# Patient Record
Sex: Male | Born: 1965 | Race: White | Hispanic: No | Marital: Married
Health system: Midwestern US, Academic
[De-identification: ages and names within clinical notes are randomized; demographics above are authoritative.]

---

## 2020-08-06 ENCOUNTER — Encounter: Admit: 2020-08-06 | Discharge: 2020-08-06 | Payer: BC Managed Care – PPO | Primary: Primary Care

## 2020-08-06 NOTE — Telephone Encounter
Navigation Intake Assessment Document    Patient Name:  Alejandro Robinson  DOB:  May 30, 1966  Insurance:   Rudean Curt    Appointment Info:    Future Appointments   Date Time Provider Department Center   08/21/2020  1:00 PM Cori Razor, MD [facility] Exam     Diagnosis & Reason for Visit:  Lymphadenopathy     Physician Info:   Referring Physician:  [name], FNP - [facility]    Contact Name & Number:  Angelina Sheriff. [phone]    Location of Films:  PACS     Records can be viewed in the Outside Records tab.     History of Present Illness:  Patient is a 54 year old male who reports swollen lymph nodes in his neck for about 2 years, though they are slightly worse now than before. An ultrasound was ordered by his PCP and done on 07/25/20 showing enlarged cervical lymph nodes bilaterally. A CT scan was then done on 08/05/20 showing extensive bilateral cervical, supraclavicular, and mediastinal adenopathy with leading consideration being lymphoma. He is now being referred to hematology for further evaluation. The patient denies any B-symptoms.     Allergies reviewed and verified with the patient, and documented in Epic:  Yes    Comments:  COVID-19 guidelines reviewed with patient, including: visitor and universal masking policies, and a temperature check at the facility entrance upon arrival.

## 2020-08-08 ENCOUNTER — Encounter: Admit: 2020-08-08 | Discharge: 2020-08-08 | Payer: BC Managed Care – PPO | Primary: Primary Care

## 2020-08-21 ENCOUNTER — Encounter: Admit: 2020-08-21 | Discharge: 2020-08-21 | Payer: BC Managed Care – PPO | Primary: Primary Care

## 2020-08-21 DIAGNOSIS — R591 Generalized enlarged lymph nodes: Secondary | ICD-10-CM

## 2020-08-21 DIAGNOSIS — Z5181 Encounter for therapeutic drug level monitoring: Secondary | ICD-10-CM

## 2020-08-21 LAB — CBC AND DIFF
Lab: 0.1 K/UL (ref 0–0.20)
Lab: 0.3 K/UL (ref 0–0.45)
Lab: 0.8 K/UL (ref 0–0.80)
Lab: 1 % (ref 0–2)
Lab: 1.9 K/UL (ref 1.0–4.8)
Lab: 11 % (ref 4–12)
Lab: 13 % (ref 11–15)
Lab: 14 g/dL (ref 13.5–16.5)
Lab: 26 % (ref 24–44)
Lab: 264 K/UL (ref 150–400)
Lab: 29 pg (ref 26–34)
Lab: 3 % (ref >60)
Lab: 33 g/dL (ref 32.0–36.0)
Lab: 4.3 K/UL (ref 1.8–7.0)
Lab: 4.9 M/UL (ref 4.4–5.5)
Lab: 44 % (ref 40–50)
Lab: 59 % (ref 41–77)
Lab: 7.3 K/UL (ref 4.5–11.0)
Lab: 7.5 FL (ref 7–11)
Lab: 89 FL (ref 80–100)

## 2020-08-21 LAB — COMPREHENSIVE METABOLIC PANEL
Lab: 143 MMOL/L (ref 137–147)
Lab: 5.2 MMOL/L — ABNORMAL HIGH (ref 3.5–5.1)

## 2020-08-21 LAB — HEPATITIS B CORE IGM AB

## 2020-08-21 LAB — URIC ACID: Lab: 4.7 mg/dL — ABNORMAL HIGH (ref 4.0–8.0)

## 2020-08-21 LAB — IMMUNOGLOBULINS-IGA,IGG,IGM
Lab: 119 mg/dL (ref 70–390)
Lab: 832 mg/dL (ref 762–1488)

## 2020-08-21 LAB — HEPATITIS C ANTIBODY W REFLEX HCV PCR QUANT

## 2020-08-21 LAB — HEPATITIS B SURFACE AB: Lab: NEGATIVE

## 2020-08-21 LAB — HEPATITIS B SURFACE AG

## 2020-08-21 LAB — LDH-LACTATE DEHYDROGENASE: Lab: 222 U/L — ABNORMAL HIGH (ref 100–210)

## 2020-08-21 NOTE — Progress Notes
Name: Alejandro Robinson          MRN: [id number]      DOB: 1966-06-08      AGE: 55 y.o.   DATE OF SERVICE: 08/21/2020    Subjective:             Reason for Visit: Hematology oncology consultation to evaluate patient with diffuse abnormal lymphadenopathy, suspected lymphoma    Requesting provider: [name], FNP    Heme/Onc Care      Alejandro Robinson is a 55 y.o. male.       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    He has noted some mild adenopathy in bilateral neck regions for approximately 2 years and over the last 8 months has noted an increase in adenopathy as well as some inguinal adenopathy left greater than right.  He denies any fevers sweats pruritus chills or weight loss but his wife Alejandro Robinson does indicate he has experienced perhaps some sweats and some fatigue.  The nodes are not painful.  In general he feels well.  He denies any anorexia or weight loss.    He presented to his local nurse practitioner Alejandro Piedra, FNP at the [facility] clinic recently and underwent lab work 07/23/2020.  CBC, CMP, and PSA were normal.  07/25/2020 ultrasound did show enlarged bilateral cervical adenopathy.    08/05/2020 CT scan showed extensive bilateral cervical supraclavicular and mediastinal adenopathy with leading consideration being lymphoma.    08/12/2020 PET scan performed at [facility] does show diffuse adenopathy of the Ballentine tonsils with SUV of 8.25, extensive bilateral cervical chain adenopathy with lower SUVs of 2.9, 2.2, and 1.8, significant abnormal mediastinal and axillary adenopathy, abnormal retroperitoneal pelvic sidewall external iliac and inguinal adenopathy.  There is no splenomegaly.    Past medical history positive only for previous left hand repair and removal of left knee meniscus via arthroscopic procedure.  He did have a lumbar puncture at age 4 for evaluation of headaches.  No previous blood transfusions.    Social history: He is a never smoker but does use snuff.  He drinks approximately 4-5 shots of liquor per week.  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  He is accompanied by his wife Alejandro Robinson who is 1/5 grade teacher at [employer] in [place].  Alejandro Robinson and Alejandro Robinson have 2 sons and 2 daughters.    Family history: Mother deceased due to breast cancer at age 81.  Maternal grandfather with prostate cancer at age 5.       Review of Systems  Generally feeling well.  Perhaps some fatigue.  See HPI.  He does believe that he had a COVID infection in November 2020 around Thanksgiving.  He did recently have a flu vaccination.  He has not yet undergone COVID vaccination but is very agreeable to do so.    Constitutional: Negative for fever and chills.   HENT: Negative for ear pain, sore throat, mouth sores, trouble swallowing, neck pain and sinus pressure.    Eyes: Negative for visual disturbance.   Respiratory: Negative for shortness of breath.    Cardiovascular: Negative for chest pain.   Gastrointestinal: Negative for nausea, diarrhea, constipation and blood in stool.   Genitourinary: Negative for dysuria, urgency, frequency, difficulty urinating and dyspareunia.   Musculoskeletal: Negative.  Neurological: Negative for weakness, numbness and headaches.   Hematological: Does not bruise/bleed easily.   Psychiatric/Behavioral: Negative for confusion and decreased concentration. The patient is not nervous/anxious.        Objective:         ? multivitamin (MULTIPLE VITAMIN PO) Take  by mouth.   ? other medication 1 Dose. Ageless Male 2 daily   ? other medication 1 Dose. Super Beta Prostate     Vitals:    08/21/20 1258   BP: 133/73   BP Source: Arm, Left Upper   Patient Position: Sitting   Pulse: 57   Resp: 18   Temp: 36.2 ?C (97.2 ?F)   TempSrc: Temporal   SpO2: 100%   Weight: 88.5 kg (195 lb)   Height: 182.9 cm (72)   PainSc: Zero     Body mass index is 26.45 kg/m?[name]     Pain Score: Zero       Fatigue Scale: 0-None    Pain Addressed:  N/A    Patient Evaluated for a Clinical Trial: No treatment clinical trial available for this patient.     [place] Cooperative Oncology Group performance status is 0, Fully active, able to carry on all pre-disease performance without restriction.[name]     Physical Exam     Very pleasant well-appearing Caucasian male in no distress.  HEENT: Slightly ruddy facial complexion no scleral icterus.  Neck: Multiple enlarged rubbery cervical and supraclavicular and occipital nodes.  Axilla: Bilateral rubbery axillary adenopathy right greater than left.  Abdomen: Scaphoid.  No hepatosplenomegaly.  Mild bilateral inguinal adenopathy.  Neuro: Alert and oriented x3.     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  I reviewed the outside lab work as well as all of the radiology imaging.  I discussed with the patient and his wife that this may represent a low-grade lymphoma such as follicular lymphoma as symptoms have been present for 2 years but diffuse large cell lymphoma is also on the differential.  We will arrange for baseline lab work to include hepatitis B and C serology, uric acid, LDH, quantitative immunoglobulins.  We will plan baseline echocardiogram and case anthracycline chemotherapy is required.  We will plan right axillary ultrasound-guided lymph node biopsy and bone marrow biopsy to determine lymphoma subtype and complete staging.  Will arrange a follow-up appointment approximately 4 to 5 days after his node and bone marrow biopsies to discuss final staging, subtype, and treatment recommendations.  He understands that he will require treatment with chemotherapy and chemotherapy will be determined by the subtype of non-Hodgkin's lymphoma.  I discussed with him that it is possible that he may require stem cell transplant or CAR-T cell therapy in the future depending upon subtype and remission status after treatment.    Family history of malignancies  Sagan mother died quite young of breast cancer at age 4 and maternal grandfather had prostate cancer.  At one of our follow-up visits we will plan genetic consultation and testing as this could impact Rosaire and his family members for deleterious mutation is found.    45 minutes was spent with the patient and his wife and all their questions were answered to their satisfaction.  An additional 35 minutes was spent in review of records, documentation, and coordination of care.    This note is partially generated using voice recognition software.  Please excuse any  typographical errors.

## 2020-08-22 ENCOUNTER — Encounter: Admit: 2020-08-22 | Discharge: 2020-08-22 | Payer: BC Managed Care – PPO | Primary: Primary Care

## 2020-08-22 NOTE — Progress Notes
Received call from [place] at [facility] stating radiologist has reviewed imaging and prefers to do left axilla lymph node biopsy as it is more amendable to biopsy. Provided approval to proceed with radiologist's preference of prominent lymph node in axilla. Appreciative.

## 2020-09-10 ENCOUNTER — Encounter: Admit: 2020-09-10 | Discharge: 2020-09-10 | Payer: BC Managed Care – PPO | Primary: Primary Care

## 2020-09-10 DIAGNOSIS — C880 Waldenstrom macroglobulinemia: Secondary | ICD-10-CM

## 2020-09-10 DIAGNOSIS — R591 Generalized enlarged lymph nodes: Secondary | ICD-10-CM

## 2020-09-10 DIAGNOSIS — Z5181 Encounter for therapeutic drug level monitoring: Secondary | ICD-10-CM

## 2020-09-10 DIAGNOSIS — R5383 Other fatigue: Secondary | ICD-10-CM

## 2020-09-10 MED ORDER — ONDANSETRON 8 MG PO TBDI
16 mg | Freq: Once | ORAL | 0 refills
Start: 2020-09-10 — End: ?

## 2020-09-10 MED ORDER — DEXAMETHASONE 6 MG PO TAB
12 mg | Freq: Once | ORAL | 0 refills
Start: 2020-09-10 — End: ?

## 2020-09-10 MED ORDER — MEPERIDINE (PF) 25 MG/ML IJ SYRG
25 mg | INTRAVENOUS | 0 refills | PRN
Start: 2020-09-10 — End: ?

## 2020-09-10 MED ORDER — BENDAMUSTINE (BENDEKA) IVPB
90 mg/m2 | Freq: Once | INTRAVENOUS | 0 refills
Start: 2020-09-10 — End: ?

## 2020-09-10 MED ORDER — ACETAMINOPHEN 325 MG PO TAB
650 mg | Freq: Once | ORAL | 0 refills
Start: 2020-09-10 — End: ?

## 2020-09-10 MED ORDER — RITUXIMAB-PVVR IVPB
375 mg/m2 | Freq: Once | INTRAVENOUS | 0 refills
Start: 2020-09-10 — End: ?

## 2020-09-10 MED ORDER — DIPHENHYDRAMINE HCL 25 MG PO CAP
25 mg | Freq: Once | ORAL | 0 refills
Start: 2020-09-10 — End: ?

## 2020-09-10 MED ORDER — ALLOPURINOL 300 MG PO TAB
300 mg | ORAL_TABLET | Freq: Every day | ORAL | 0 refills | 60.00000 days | Status: AC
Start: 2020-09-10 — End: ?

## 2020-09-10 NOTE — Progress Notes
Name: Alejandro Robinson          MRN: [id number]      DOB: 1966/08/04      AGE: 55 y.o.   DATE OF SERVICE: 09/10/2020    Subjective:             Reason for Visit: Review of pathology and discussion of treatment recommendations for newly diagnosed Waldenstrom's macroglobulinemia    Requesting provider: [name], FNP    Heme/Onc Care      Alejandro Robinson is a 55 y.o. male.       History of Present Illness   Alejandro Robinson is seen today along with his wife Alejandro Robinson to review pathology from his lymph node and bone marrow biopsies and to discuss treatment for his newly diagnosed Waldenstrom macroglobulinemia.    Axillary lymph node biopsy did show a lymphoplasmacytic lymphoma consistent with Waldenstr?m macroglobulinemia.  Bone marrow biopsy showed 20% involvement.    Baseline echocardiogram was excellent with EF of 60 to 65%.    I reviewed the diagnosis of Waldenstr?m's macroglobulinemia and reviewed treatment recommendations as well as staging and the different types of lymphoma.  I did provide Alejandro Robinson and a Alejandro Robinson with 2 booklets from the leukemia and lymphoma Society describing different types of non-Hodgkin's lymphoma.  We discussed that this lymphoma is sometimes treated with an oral bruton tyrosine kinase inhibitor but this is more in an older individual or an individual who has had a relapse of disease.  My recommendation is for treatment with bendamustine and Rituxan x6 courses.  I provided handouts today on this treatment regimen.    We will plan a chemotherapy treatment appointment next week and then plan to initiate therapy in the next few weeks.  We did call in allopurinol for prophylaxis and Zofran.    We did discuss the risk of hyperviscosity with this low-grade lymphoma.  This is more common when a individual has an IgM level of greater than 4000.  Fortunately, his IgM level is only in the low 600s so likely will not have issues with viscosity.  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  His wife notes he has been more fatigued the last 6 months likely related to his underlying lymphoma but we will also check additional lab work next week.  He does note headaches on occasion.  In the winter he does have a slight tendency to nosebleeds but it has not been any different this year.        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    He has noted some mild adenopathy in bilateral neck regions for approximately 2 years and over the last 8 months has noted an increase in adenopathy as well as some inguinal adenopathy left greater than right.  He denies any fevers sweats pruritus chills or weight loss but his wife Alejandro Robinson does indicate he has experienced perhaps some sweats and some fatigue.  The nodes are not painful.  In general he feels well.  He denies any anorexia or weight loss.    He presented to his local nurse practitioner [name], FNP at the [facility] clinic recently and underwent lab work 07/23/2020.  CBC, CMP, and PSA were normal.  07/25/2020 ultrasound did show enlarged bilateral cervical adenopathy.  08/05/2020 CT scan showed extensive bilateral cervical supraclavicular and mediastinal adenopathy with leading consideration being lymphoma.    08/12/2020 PET scan performed at [facility] does show diffuse adenopathy of the Ballentine tonsils with SUV of 8.25, extensive bilateral cervical chain adenopathy with lower SUVs of 2.9, 2.2, and 1.8, significant abnormal mediastinal and axillary adenopathy, abnormal retroperitoneal pelvic sidewall external iliac and inguinal adenopathy.  There is no splenomegaly.    Past medical history positive only for previous left hand repair and removal of left knee meniscus via arthroscopic procedure.  He did have a lumbar puncture at age 81 for evaluation of headaches.  No previous blood transfusions.    Social history: He is a never smoker but does use snuff.  He drinks approximately 4-5 shots of liquor per week.  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  He is accompanied by his wife Alejandro Robinson who is 1/5 grade teacher at [employer] in [place].  Alejandro Robinson and Alejandro Robinson have 2 sons and 2 daughters.    Family history: Mother deceased due to breast cancer at age 45.  Maternal grandfather with prostate cancer at age 63.       Review of Systems  Generally feeling well.  Perhaps some fatigue for the last 6 months.  See HPI.  He does believe that he had a COVID infection in November 2020 around Thanksgiving.  He did recently have a flu vaccination.  He has not yet undergone COVID vaccination but is very agreeable to do so.    Constitutional: Negative for fever and chills.   HENT: Negative for ear pain, sore throat, mouth sores, trouble swallowing, neck pain and occasionally positive for some sinus pressure.    Eyes: Negative for visual disturbance.   Respiratory: Negative for shortness of breath.    Cardiovascular: Negative for chest pain.   Gastrointestinal: Negative for nausea, diarrhea, constipation and blood in stool.   Genitourinary: Negative for dysuria, urgency, frequency, difficulty urinating and dyspareunia.   Musculoskeletal: Negative.    Neurological: Negative for weakness, numbness and positive for some headaches.   Hematological: Does not bruise/bleed easily.   Psychiatric/Behavioral: Negative for confusion and decreased concentration. The patient is not nervous/anxious.        Objective:         ? allopurinoL (ZYLOPRIM) 300 mg tablet Take one tablet by mouth daily. Take with food.   ? multivitamin (MULTIPLE VITAMIN PO) Take  by mouth.   ? other medication 1 Dose. Ageless Male 2 daily   ? other medication 1 Dose. Super Beta Prostate     Vitals:    09/10/20 1526   BP: 135/72   BP Source: Arm, Right Upper   Patient Position: Sitting   Pulse: 65   Resp: 15   Temp: 36.8 ?C (98.2 ?F)   SpO2: 98%   Weight: 90.3 kg (199 lb)   Height: 185.4 cm (73)   PainSc: Zero     Body mass index is 26.25 kg/m?[name]     Pain Score: Zero       Fatigue Scale: 5    Pain Addressed:  N/A    Patient Evaluated for a Clinical Trial: No treatment clinical trial available for this patient.     [place] Cooperative Oncology Group performance status is 0, Fully active, able to carry on all pre-disease performance without restriction.[name]     Physical Exam     Very pleasant well-appearing Caucasian  male in no distress.  HEENT: Slightly ruddy facial complexion no scleral icterus.  Does have a mild rash in both temporal regions.  Neck: Multiple enlarged rubbery cervical and supraclavicular and occipital nodes.  Axilla: Bilateral rubbery axillary adenopathy right greater than left.  Abdomen: Scaphoid.  No hepatosplenomegaly.  Mild bilateral inguinal adenopathy.  Neuro: Alert and oriented x3.     Assessment and Plan:    Problem   Waldenstrom Macroglobulinemia (Hcc)     Waldenstrom macroglobulinemia  I reviewed the results of the recent excisional lymph node biopsy and bone marrow biopsy with Alejandro Robinson and Alejandro Robinson.  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  I reviewed that this is a low-grade type of non-Hodgkin's lymphoma.  This is stage IV based on the marrow involvement.  I reviewed with Alejandro Robinson and Alejandro Robinson that typically this lymphoma does present with marrow involvement which is not unusual at all.  We discussed treatment indications and recommendations.    We will plan a chemotherapy teach appointment next week and will be obtaining additional lab work at that time.  We will plan treatment with Rituxan and bendamustine every 28 days x 6 courses.  We are calling in allopurinol and Zofran.  He knows not to start the medications until he has had a teaching appointment.  We discussed that this is a low-grade lymphoma and can recur sometime after treatment, times a few years out, so at completion of treatment we will plan reassessment with scans and then scans and lab work intermittently in surveillance.    Family history of malignancies  Alejandro Robinson mother died quite young of breast cancer at age 73 and maternal grandfather had prostate cancer.  At one of our follow-up visits we will plan genetic consultation and testing as this could impact Damaris and his family members if a deleterious mutation is found.      30 minutes was spent with the patient and his wife and all their questions were answered to their satisfaction.  An additional 35 minutes was spent in review of records, documentation, and coordination of care.    This note is partially generated using voice recognition software.  Please excuse any typographical errors.

## 2020-09-16 ENCOUNTER — Encounter: Admit: 2020-09-16 | Discharge: 2020-09-16 | Payer: BC Managed Care – PPO | Primary: Primary Care

## 2020-09-16 DIAGNOSIS — R591 Generalized enlarged lymph nodes: Secondary | ICD-10-CM

## 2020-09-16 DIAGNOSIS — R5383 Other fatigue: Secondary | ICD-10-CM

## 2020-09-16 DIAGNOSIS — Z719 Counseling, unspecified: Secondary | ICD-10-CM

## 2020-09-16 DIAGNOSIS — C880 Waldenstrom macroglobulinemia: Secondary | ICD-10-CM

## 2020-09-16 LAB — COMPREHENSIVE METABOLIC PANEL
Lab: 10 mg/dL (ref 8.5–10.6)
Lab: 145 MMOL/L (ref 137–147)
Lab: 18 U/L (ref 7–56)
Lab: 4.6 g/dL (ref 3.5–5.0)
Lab: 44 U/L (ref 25–110)
Lab: 5.2 MMOL/L — ABNORMAL HIGH (ref 3.5–5.1)
Lab: 7 % (ref 3–12)

## 2020-09-16 LAB — CBC AND DIFF
Lab: 0.1 K/UL (ref 0–0.20)
Lab: 0.2 K/UL (ref 0–0.45)
Lab: 0.8 K/UL (ref 0–0.80)
Lab: 1 % (ref 0–2)
Lab: 1.7 K/UL (ref 1.0–4.8)
Lab: 14 g/dL (ref 13.5–16.5)
Lab: 29 pg (ref 26–34)
Lab: 32 g/dL (ref 32.0–36.0)
Lab: 4 % (ref >60)
Lab: 4 K/UL (ref 1.8–7.0)
Lab: 4.8 M/UL (ref 4.4–5.5)
Lab: 43 % — ABNORMAL HIGH (ref 40–50)
Lab: 6.7 K/UL (ref 4.5–11.0)

## 2020-09-16 LAB — VITAMIN B12: Lab: 391 pg/mL (ref 180–914)

## 2020-09-16 LAB — IRON + BINDING CAPACITY + %SAT+ FERRITIN
Lab: 101 ng/mL (ref 30–300)
Lab: 18 % — ABNORMAL LOW (ref 28–42)
Lab: 326 ug/dL (ref 270–380)
Lab: 59 ug/dL (ref 50–185)

## 2020-09-16 LAB — FOLATE, SERUM: Lab: 16 ng/mL (ref >3.9)

## 2020-09-16 NOTE — Assessment & Plan Note
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  IgM 601.  Plan for rituxan and bendamustine x 6 cycles.

## 2020-09-16 NOTE — Assessment & Plan Note
APP Chemotherapy Education    IV Chemotherapy: The following is a summary of the patient's IV Chemotherapy Education.    A thorough pre-assessment and teaching session explaining the mechanism of action, possible side effects, precautions and instructions regarding rituximab, bendamustine for control intent was conducted. The patient will return to initiate treatment. The cycle will repeat every 28 days for a total of 6 cycles. .  Plan of administration was reviewed.      Both written and verbal information were given to the patient.    The planned course of treatment, anticipated benefits, material risks and potential side effects that may occur with this course of treatment were explained to the patient.  Side effects and their management were discussed in detail and include, but are not limited to: nausea, vomiting, diarrhea, fatigue, muscle aches, flu-like symptoms, liver damage, renal damage, hair thinning, hypersensitivity reactions, myelosuppression.    Reproductive concerns were not discussed with the patient  Infertility risks were not applicable and therefore not discussed    Appropriate handling of body secretions and waste at home were reviewed as applicable.    Prescriptions for supportive medications including zofran were e-scripted to their pharmacy and discussed in detail how to take. Drug to drug interactions were reviewed as applicable.     The patient has received contact information for the clinic and was instructed on when and who to call.     The patient verbalized understanding, was given the opportunity to ask questions, and the consent form was signed.     Follow up appointment with nurse practitioner for tox check.    Lab today.    This was a 45 minute face to face encounter with 50 minutes spent in counseling and coordination of care.

## 2020-09-16 NOTE — Progress Notes
Date of Service: 09/16/2020               Reason for Visit:  No chief complaint on file.        Subjective:        History of Present Illness  Alejandro Robinson is a 55 y.o. male is a patient of Dr. Willette Pa who is here for follow up for newly diagnosed Waldenstrom macroglobulinemia.  He is here today to learn about Rituxan + bendamustine.  His wife is with him today.        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  ?  He has noted some mild adenopathy in bilateral neck regions for approximately 2 years and over the last 8 months has noted an increase in adenopathy as well as some inguinal adenopathy left greater than right.  He denies any fevers sweats pruritus chills or weight loss but his wife [name] does indicate he has experienced perhaps some sweats and some fatigue.  The nodes are not painful.  In general he feels well.  He denies any anorexia or weight loss.  ?  He presented to his local nurse practitioner [name], FNP at the [facility] clinic recently and underwent lab work 07/23/2020.  CBC, CMP, and PSA were normal.  07/25/2020 ultrasound did show enlarged bilateral cervical adenopathy.  ?  08/05/2020 CT scan showed extensive bilateral cervical supraclavicular and mediastinal adenopathy with leading consideration being lymphoma.  ?  08/12/2020 PET scan performed at [facility] does show diffuse adenopathy of the Ballentine tonsils with SUV of 8.25, extensive bilateral cervical chain adenopathy with lower SUVs of 2.9, 2.2, and 1.8, significant abnormal mediastinal and axillary adenopathy, abnormal retroperitoneal pelvic sidewall external iliac and inguinal adenopathy.  There is no splenomegaly.    Social History     Socioeconomic History   ? Marital status: Married     Spouse name: Not on file   ? Number of children: Not on file   ? Years of education: Not on file   ? Highest education level: Not on file   Occupational History   ? Not on file   Tobacco Use   ? Smoking status: Never Smoker   ? Smokeless tobacco: Current User     Types: Snuff   Substance and Sexual Activity   ? Alcohol use: Yes     Alcohol/week: 5.0 standard drinks     Types: 5 Shots of liquor per week   ? Drug use: Not Currently   ? Sexual activity: Not on file   Other Topics Concern   ? Not on file   Social History Narrative   ? Not on file     No past medical history on file.       Review of Systems   Constitutional: Positive for fatigue.   Hematological: Positive for adenopathy.         Objective:         ? allopurinoL (ZYLOPRIM) 300 mg tablet Take one tablet by mouth daily. Take with food.   ? multivitamin (MULTIPLE VITAMIN PO) Take  by mouth.   ? other medication 1 Dose. Ageless Male 2 daily   ? other medication 1 Dose. Super Beta Prostate     There were no vitals filed for this visit.  There is no height or weight on file to calculate BMI.  Pain Addressed:  N/A    Patient Evaluated for a Clinical Trial: Patient not eligible for a treatment trial (including not needing treatment, needs palliative care, in remission).     [place] Cooperative Oncology Group performance status is 0, Fully active, able to carry on all pre-disease performance without restriction..    Labs:  CBC w/Diff    Lab Results   Component Value Date/Time    WBC 7.3 08/21/2020 01:33 PM    RBC 4.97 08/21/2020 01:33 PM    HGB 14.7 08/21/2020 01:33 PM    HCT 44.3 08/21/2020 01:33 PM    MCV 89.1 08/21/2020 01:33 PM    MCH 29.6 08/21/2020 01:33 PM    MCHC 33.2 08/21/2020 01:33 PM    RDW 13.5 08/21/2020 01:33 PM    PLTCT 264 08/21/2020 01:33 PM    MPV 7.5 08/21/2020 01:33 PM    Lab Results   Component Value Date/Time    NEUT 59 08/21/2020 01:33 PM    ANC 4.30 08/21/2020 01:33 PM    LYMA 26 08/21/2020 01:33 PM    ALC 1.90 08/21/2020 01:33 PM    MONA 11 08/21/2020 01:33 PM    AMC 0.80 08/21/2020 01:33 PM    EOSA 3 08/21/2020 01:33 PM    AEC 0.30 08/21/2020 01:33 PM    BASA 1 08/21/2020 01:33 PM    ABC 0.10 08/21/2020 01:33 PM        Comprehensive Metabolic Profile    Lab Results   Component Value Date/Time    NA 143 08/21/2020 01:33 PM    K 5.2 (H) 08/21/2020 01:33 PM    CL 104 08/21/2020 01:33 PM    CO2 30 08/21/2020 01:33 PM    GAP 9 08/21/2020 01:33 PM    BUN 16 08/21/2020 01:33 PM    CR 0.97 08/21/2020 01:33 PM    GLU 89 08/21/2020 01:33 PM    Lab Results   Component Value Date/Time    CA 9.8 08/21/2020 01:33 PM    ALBUMIN 4.8 08/21/2020 01:33 PM    TOTPROT 7.2 08/21/2020 01:33 PM    ALKPHOS 48 08/21/2020 01:33 PM    AST 25 08/21/2020 01:33 PM    ALT 17 08/21/2020 01:33 PM    TOTBILI 0.6 08/21/2020 01:33 PM           Physical Exam  Vitals reviewed.   Constitutional:       Appearance: He is well-developed.   HENT:      Head: Normocephalic and atraumatic.   Pulmonary:      Effort: Pulmonary effort is normal.   Skin:     General: Skin is warm and dry.   Neurological:      Mental Status: He is alert and oriented to person, place, and time.   Psychiatric:         Behavior: Behavior normal.         Thought Content: Thought content normal.         Judgment: Judgment normal.                 Assessment and Plan:    Problem   Encounter for Education   Waldenstrom Macroglobulinemia (Hcc)        ICD-9-CM ICD-10-CM    1. Waldenstrom macroglobulinemia (HCC)  273.3 C88.0    2. Diffuse lymphadenopathy  785.6 R59.1    3. 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  Plan for rituxan and bendamustine x 6 cycles.      Encounter for education  APP Chemotherapy Education    IV Chemotherapy: The following is a summary of the patient's IV Chemotherapy Education.    A thorough pre-assessment and teaching session explaining the mechanism of action, possible side effects, precautions and instructions regarding rituximab, bendamustine for control intent was conducted. The patient will return to initiate treatment. The cycle will repeat every 28 days for a total of 6 cycles. .  Plan of administration was reviewed.      Both written and verbal information were given to the patient.    The planned course of treatment, anticipated benefits, material risks and potential side effects that may occur with this course of treatment were explained to the patient.  Side effects and their management were discussed in detail and include, but are not limited to: nausea, vomiting, diarrhea, fatigue, muscle aches, flu-like symptoms, liver damage, renal damage, hair thinning, hypersensitivity reactions, myelosuppression.    Reproductive concerns were not discussed with the patient  Infertility risks were not applicable and therefore not discussed    Appropriate handling of body secretions and waste at home were reviewed as applicable.    Prescriptions for supportive medications including zofran were e-scripted to their pharmacy and discussed in detail how to take. Drug to drug interactions were reviewed as applicable.     The patient has received contact information for the clinic and was instructed on when and who to call.     The patient verbalized understanding, was given the opportunity to ask questions, and the consent form was signed.     Follow up appointment with nurse practitioner for tox check.    Lab today.    This was a 45 minute face to face encounter with 50 minutes spent in counseling and coordination of care.                 Patient and wife verbalized understanding and all questions were answered to their satisfaction.  Follow up with NP for tox check.    Time spent reviewing previous notes, records and labs as well as face to face with patient, documentation, entering orders and coordination of care was 45 minutes.

## 2020-09-20 NOTE — Telephone Encounter
Patient contacted office to ask status of patient's chemotherapy since they have not been contacted to schedule.  Sent message to financial personnel to check on status.  Contacted [name] to notify that insurance has not authorized treatment yet. She is to contact our office if they have not been contacted by Monday afternoon to schedule (2-14).

## 2020-09-24 ENCOUNTER — Encounter

## 2020-09-24 DIAGNOSIS — C880 Waldenstrom macroglobulinemia: Secondary | ICD-10-CM

## 2020-09-24 MED ORDER — BENDAMUSTINE (BENDEKA) IVPB
90 mg/m2 | Freq: Once | INTRAVENOUS | 0 refills | Status: CP
Start: 2020-09-24 — End: ?
  Administered 2020-09-24 (×2): 200 mg via INTRAVENOUS

## 2020-09-24 MED ORDER — DEXAMETHASONE 6 MG PO TAB
12 mg | Freq: Once | ORAL | 0 refills | Status: CP
Start: 2020-09-24 — End: ?
  Administered 2020-09-24: 15:00:00 12 mg via ORAL

## 2020-09-24 MED ORDER — RITUXIMAB-PVVR IVPB
375 mg/m2 | Freq: Once | INTRAVENOUS | 0 refills | Status: CP
Start: 2020-09-24 — End: ?
  Administered 2020-09-24 (×3): 800 mg via INTRAVENOUS

## 2020-09-24 MED ORDER — ONDANSETRON HCL 8 MG PO TAB
ORAL_TABLET | Freq: Every day | ORAL | 3 refills | 8.00000 days | Status: AC
Start: 2020-09-24 — End: ?

## 2020-09-24 MED ORDER — ACETAMINOPHEN 325 MG PO TAB
650 mg | Freq: Once | ORAL | 0 refills | Status: CP
Start: 2020-09-24 — End: ?
  Administered 2020-09-24: 15:00:00 650 mg via ORAL

## 2020-09-24 MED ORDER — DIPHENHYDRAMINE HCL 25 MG PO CAP
25 mg | Freq: Once | ORAL | 0 refills | Status: CP
Start: 2020-09-24 — End: ?
  Administered 2020-09-24: 15:00:00 25 mg via ORAL

## 2020-09-24 MED ORDER — ONDANSETRON 8 MG PO TBDI
16 mg | Freq: Once | ORAL | 0 refills | Status: CP
Start: 2020-09-24 — End: ?
  Administered 2020-09-24: 15:00:00 16 mg via ORAL

## 2020-09-24 NOTE — Patient Instructions
[facility]  Chemotherapy Instructions    XZAYVION VAETH 09/24/2020    Chemotherapy Drugs:    Rituximab and Bendeka    Call Immediately to report the following:  Unexplained bleeding or bleeding that will not stop  Difficulty swallowing  Shortness of breath, wheezing, or trouble breathing  Rapid, irregular heartbeat; chest pain  Dizziness, lightheadedness  Rash or cut that swells or turns red, feels hot or painful, or begin to ooze  Diarrhea   Uncontrolled nausea or vomiting  Fever of 100.4 F or higher, or chills    Important Phone Numbers:  [place] Number (answered 24 hours a day) [place] (appointments) [phone]  Social Worker [phone]

## 2020-09-24 NOTE — Progress Notes
CHEMO NOTE  Verified chemo consent signed and in chart.    Verified initiate chemo order in O2    Blood return positive via: Peripheral (22 ga)    BSA and dose double checked (agree with orders as written) with: yes Georgette Dover, RN    Labs/applicable tests checked: CBC and Comprehensive Metabolic Panel (CMP)    Chemo regime: Drug/cycle/day Rituximab 800mg  and Bendeka 200mg  C1D1    Rate verified and armband double checkwith second RN: yes Trisha Mangle, RN; Georgette Dover, RN; Serita Sheller, RN; Langley Gauss, RN; and Karenann Cai, RN    Patient education offered and stated understanding. Denies questions at this time.    Pt here for C1D1 Rituximab and Bendeka. No concerns voiced, reviewed treatment plan with pt and addressed any questions or concerns.Treatment parameters met. PIV placed in right forearm, positive blood return noted. Pre-meds given 30 minutes prior to treatment. Rituximab and Bendeka infused as ordered, without complication. Post treatment, IV flushed with saline, then removed, compression dressing applied. Pt discharged in stable condition. To return tomorrow for C1D2.

## 2020-09-25 ENCOUNTER — Encounter

## 2020-09-25 DIAGNOSIS — C880 Waldenstrom macroglobulinemia: Secondary | ICD-10-CM

## 2020-09-25 MED ORDER — BENDAMUSTINE (BENDEKA) IVPB
90 mg/m2 | Freq: Once | INTRAVENOUS | 0 refills | Status: CP
Start: 2020-09-25 — End: ?
  Administered 2020-09-25 (×2): 200 mg via INTRAVENOUS

## 2020-09-25 MED ORDER — ONDANSETRON 8 MG PO TBDI
16 mg | Freq: Once | ORAL | 0 refills | Status: CP
Start: 2020-09-25 — End: ?
  Administered 2020-09-25: 19:00:00 16 mg via ORAL

## 2020-09-25 MED ORDER — DEXAMETHASONE 6 MG PO TAB
12 mg | Freq: Once | ORAL | 0 refills | Status: CP
Start: 2020-09-25 — End: ?
  Administered 2020-09-25: 19:00:00 12 mg via ORAL

## 2020-09-25 NOTE — Patient Instructions
[facility]  Chemotherapy Instructions    Alejandro Robinson 09/25/2020    Chemotherapy Drugs:    Bendeka    Call Immediately to report the following:  Unexplained bleeding or bleeding that will not stop  Difficulty swallowing  Shortness of breath, wheezing, or trouble breathing  Rapid, irregular heartbeat; chest pain  Dizziness, lightheadedness  Rash or cut that swells or turns red, feels hot or painful, or begin to ooze  Diarrhea   Uncontrolled nausea or vomiting  Fever of 100.4 F or higher, or chills    Important Phone Numbers:  [place] Number (answered 24 hours a day) [place] (appointments) [phone]  Social Worker [phone]

## 2020-09-25 NOTE — Progress Notes
CHEMO NOTE  Verified chemo consent signed and in chart.    Verified initiate chemo order in O2    Blood return positive via: Peripheral (22 ga)    BSA and dose double checked (agree with orders as written) with: yes Baltazar Najjar, RN    Labs/applicable tests checked: None    Chemo regime: Drug/cycle/day Bendeka 273m C1D2    Rate verified and armband double checkwith second RN: yes KLangley Gauss RN    Patient education offered and stated understanding. Denies questions at this time.    Pt here for C1D2 Bendeka. No concerns voiced. PIV placed in right forearm, positive blood return noted. Treatment parameters met. Pre-meds given 30 minutes prior. Bendeka infused as ordered, without complication. Post treatment, IV flushed with saline, then removed, compression dressing applied. Pt discharged in stable condition. To return as scheduled.

## 2020-10-01 ENCOUNTER — Encounter: Admit: 2020-10-01 | Discharge: 2020-10-01 | Payer: BC Managed Care – PPO | Primary: Primary Care

## 2020-10-01 DIAGNOSIS — F19982 Other psychoactive substance use, unspecified with psychoactive substance-induced sleep disorder: Secondary | ICD-10-CM

## 2020-10-01 DIAGNOSIS — K5903 Drug induced constipation: Secondary | ICD-10-CM

## 2020-10-01 DIAGNOSIS — C880 Waldenstrom macroglobulinemia: Secondary | ICD-10-CM

## 2020-10-01 NOTE — Progress Notes
Date of Service: 10/01/2020               Reason for Visit:  Heme/Onc Care    Obtained patient's verbal consent to treat them and their agreement to [facility] financial policy and NPP via this telehealth visit during the [facility].  Telephone call 8 minutes.      Subjective:        History of Present Illness  Alejandro Robinson is a 55 y.o. male is a patient of Dr. Willette Pa who is here for follow up for diagnosed Waldenstrom macroglobulinemia.  He is on telephone visit for toxicity check.      He feels like he did well with cycle 1.  He did not have any nausea, vomiting, diarrhea, rashes, itching, hypersensitivity, mouth sores.  He had a few nights of insomnia and constipation.  He took a stool softener and it alleviated issue.  He denies any pain today.  No fever or chills.  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  ?  He has noted some mild adenopathy in bilateral neck regions for approximately 2 years and over the last 8 months has noted an increase in adenopathy as well as some inguinal adenopathy left greater than right.  He denies any fevers sweats pruritus chills or weight loss but his wife [name] does indicate he has experienced perhaps some sweats and some fatigue.  The nodes are not painful.  In general he feels well.  He denies any anorexia or weight loss.  ?  He presented to his local nurse practitioner [name], FNP at the [facility] clinic recently and underwent lab work 07/23/2020.  CBC, CMP, and PSA were normal.  07/25/2020 ultrasound did show enlarged bilateral cervical adenopathy.  ?  08/05/2020 CT scan showed extensive bilateral cervical supraclavicular and mediastinal adenopathy with leading consideration being lymphoma.  ?  08/12/2020 PET scan performed at [facility] does show diffuse adenopathy of the Ballentine tonsils with SUV of 8.25, extensive bilateral cervical chain adenopathy with lower SUVs of 2.9, 2.2, and 1.8, significant abnormal mediastinal and axillary adenopathy, abnormal retroperitoneal pelvic sidewall external iliac and inguinal adenopathy.  There is no splenomegaly.    09/24/20 cycle 1 rituximab, bendamustine.    Social History     Socioeconomic History   ? Marital status: Married     Spouse name: Not on file   ? Number of children: Not on file   ? Years of education: Not on file   ? Highest education level: Not on file   Occupational History   ? Not on file   Tobacco Use   ? Smoking status: Never Smoker   ? Smokeless tobacco: Current User     Types: Snuff   Substance and Sexual Activity   ? Alcohol use: Yes     Alcohol/week: 5.0 standard drinks     Types: 5 Shots of liquor per week   ? Drug use: Not Currently   ? Sexual activity: Not on file   Other Topics Concern   ? Not on file   Social History Narrative   ? Not on file     No past medical history on file.       Review of Systems   Constitutional: Negative for appetite change, diaphoresis, fatigue and unexpected weight change.  HENT: Negative for mouth sores and trouble swallowing.    Respiratory: Negative.    Cardiovascular: Negative for leg swelling.   Gastrointestinal: Positive for constipation. Negative for diarrhea, nausea and vomiting.   Genitourinary: Negative.    Musculoskeletal: Negative.    Skin: Negative for rash.   Psychiatric/Behavioral: Positive for sleep disturbance.         Objective:         ? allopurinoL (ZYLOPRIM) 300 mg tablet Take one tablet by mouth daily. Take with food.   ? multivitamin (MULTIPLE VITAMIN PO) Take  by mouth.   ? ondansetron HCL (ZOFRAN) 8 mg tablet Take 2 tablets by mouth daily on Days 3 and 4 of each cycle, then take 1 tablet every 8 hours as needed for nausea and vomiting.   ? other medication 1 Dose. Ageless Male 2 daily   ? other medication 1 Dose. Super Beta Prostate     There were no vitals filed for this visit.  There is no height or weight on file to calculate BMI.             Pain Addressed:  N/A    Patient Evaluated for a Clinical Trial: Patient not eligible for a treatment trial (including not needing treatment, needs palliative care, in remission).     [place] Cooperative Oncology Group performance status is 0, Fully active, able to carry on all pre-disease performance without restriction..    Labs:  CBC w/Diff    Lab Results   Component Value Date/Time    WBC 8.2 09/24/2020 07:59 AM    RBC 4.74 09/24/2020 07:59 AM    HGB 14.1 09/24/2020 07:59 AM    HCT 41.8 09/24/2020 07:59 AM    MCV 88.1 09/24/2020 07:59 AM    MCH 29.7 09/24/2020 07:59 AM    MCHC 33.7 09/24/2020 07:59 AM    RDW 13.3 09/24/2020 07:59 AM    PLTCT 209 09/24/2020 07:59 AM    MPV 7.6 09/24/2020 07:59 AM    Lab Results   Component Value Date/Time    NEUT 70 09/24/2020 07:59 AM    ANC 5.70 09/24/2020 07:59 AM    LYMA 18 (L) 09/24/2020 07:59 AM    ALC 1.50 09/24/2020 07:59 AM    MONA 9 09/24/2020 07:59 AM    AMC 0.70 09/24/2020 07:59 AM    EOSA 2 09/24/2020 07:59 AM    AEC 0.20 09/24/2020 07:59 AM    BASA 1 09/24/2020 07:59 AM    ABC 0.10 09/24/2020 07:59 AM        Comprehensive Metabolic Profile    Lab Results   Component Value Date/Time    NA 142 09/24/2020 07:59 AM    K 4.7 09/24/2020 07:59 AM    CL 102 09/24/2020 07:59 AM    CO2 31 (H) 09/24/2020 07:59 AM    GAP 9 09/24/2020 07:59 AM    BUN 20 09/24/2020 07:59 AM    CR 1.02 09/24/2020 07:59 AM    GLU 114 (H) 09/24/2020 07:59 AM    Lab Results   Component Value Date/Time    CA 10.0 09/24/2020 07:59 AM    ALBUMIN 4.7 09/24/2020 07:59 AM    TOTPROT 7.4 09/24/2020 07:59 AM    ALKPHOS 53 09/24/2020 07:59 AM    AST 28 09/24/2020 07:59 AM    ALT 19 09/24/2020 07:59 AM    TOTBILI 0.4 09/24/2020 07:59 AM           Physical Exam  Neurological:  General: No focal deficit present.      Mental Status: He is alert and oriented to person, place, and time.   Psychiatric:         Mood and Affect: Mood normal.         Behavior: Behavior normal.         Thought Content: Thought content normal.                 Assessment and Plan:    Problem   Drug Induced Insomnia (Hcc)   Drug Induced Constipation   Waldenstrom Macroglobulinemia (Hcc)        ICD-9-CM ICD-10-CM    1. Waldenstrom macroglobulinemia (HCC)  273.3 C88.0    2. Drug induced insomnia (HCC)  292.85 F19.982    3. Drug induced constipation  564.09 K59.03     E980.5         Waldenstrom macroglobulinemia (HCC)  Stage IV Waldenstrom Macroglobulinemia with diffuse adenopathy and marrow involvement.  IgM 601.  he is on allopurinol.  Started rituxan and bendamustine on 09/24/20.  Tolerating well thus far.  Follow up with cycle 2.  Plan 6 cycles of tx.      Drug induced insomnia (HCC)  Likely due to steroids. Only lasted a few nights around treatment.   May try Melatonin or Tylenol PM if he wants.      Drug induced constipation  Likely due to treatment, possibly zofran.  He did take stool softener and had relief.  Instructed to take stool softener on day 1 and 2 (chemo days) to see if that helps with constipation.  To stop if diarrhea occurs    I have reviewed the distress thermometer and needs assessment, including physical, psychological, social, spiritual and behavioral needs.  The patient completed the assessment during this visit to identify psychosocial needs that may interfere with the patient's plan of care.  I have made the following referrals based on this assessment and discussion with the patient:  Need not indicated       Patient verbalized understanding and all questions were answered to their satisfaction.  Follow up with cycle 2    Time spent reviewing previous notes, records and labs as well as face to face with patient, documentation, entering orders and coordination of care was 20 minutes.

## 2020-10-01 NOTE — Assessment & Plan Note
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  IgM 601.  he is on allopurinol.  Started rituxan and bendamustine on 09/24/20.  Tolerating well thus far.  Follow up with cycle 2.  Plan 6 cycles of tx.

## 2020-10-01 NOTE — Assessment & Plan Note
Likely due to treatment, possibly zofran.  He did take stool softener and had relief.  Instructed to take stool softener on day 1 and 2 (chemo days) to see if that helps with constipation.  To stop if diarrhea occurs

## 2020-10-01 NOTE — Assessment & Plan Note
Likely due to steroids. Only lasted a few nights around treatment.   May try Melatonin or Tylenol PM if he wants.

## 2020-10-21 ENCOUNTER — Encounter: Admit: 2020-10-21 | Discharge: 2020-10-21 | Payer: BC Managed Care – PPO | Primary: Primary Care

## 2020-10-23 ENCOUNTER — Encounter: Admit: 2020-10-23 | Discharge: 2020-10-23 | Payer: BC Managed Care – PPO | Primary: Primary Care

## 2020-10-23 DIAGNOSIS — C880 Waldenstrom macroglobulinemia: Secondary | ICD-10-CM

## 2020-10-23 DIAGNOSIS — F19982 Other psychoactive substance use, unspecified with psychoactive substance-induced sleep disorder: Secondary | ICD-10-CM

## 2020-10-23 DIAGNOSIS — K5903 Drug induced constipation: Secondary | ICD-10-CM

## 2020-10-23 MED ORDER — ONDANSETRON 8 MG PO TBDI
16 mg | Freq: Once | ORAL | 0 refills | Status: CP
Start: 2020-10-23 — End: ?

## 2020-10-23 MED ORDER — RITUXIMAB-PVVR IVPB (SPEC VOL)
375 mg/m2 | Freq: Once | INTRAVENOUS | 0 refills | Status: CP
Start: 2020-10-23 — End: ?

## 2020-10-23 MED ORDER — DIPHENHYDRAMINE HCL 25 MG PO CAP
25 mg | Freq: Once | ORAL | 0 refills | Status: CP
Start: 2020-10-23 — End: ?

## 2020-10-23 MED ORDER — ACETAMINOPHEN 325 MG PO TAB
650 mg | Freq: Once | ORAL | 0 refills | Status: CP
Start: 2020-10-23 — End: ?

## 2020-10-23 MED ORDER — BENDAMUSTINE (BENDEKA) IVPB
90 mg/m2 | Freq: Once | INTRAVENOUS | 0 refills | Status: AC
Start: 2020-10-23 — End: ?

## 2020-10-23 MED ORDER — DEXAMETHASONE 6 MG PO TAB
12 mg | Freq: Once | ORAL | 0 refills | Status: CP
Start: 2020-10-23 — End: ?

## 2020-10-23 NOTE — Progress Notes
Name: Alejandro Robinson          MRN: [id number]      DOB: 19-Oct-1965      AGE: 55 y.o.   DATE OF SERVICE: 10/23/2020    Subjective:             Reason for Visit:  Heme/Onc Care      Alejandro Robinson is a 55 y.o. male.       History of Present Illness  Alejandro Robinson is here today along with his wife Alejandro Robinson for treatment with Rituxan plus bendamustine course #2 for his recently diagnosed Waldenstrom macroglobulinemia.       He notes that all his bulky nodes resolved a few days after his first course of treatment.  He feels like he did well with cycle 1.  He did not have any nausea, vomiting, diarrhea, rashes, itching, hypersensitivity, mouth sores.  He had a few nights of insomnia and constipation.  He took a stool softener and it alleviated issue.  He denies any pain today.  No fever or chills.  He had some slight fatigue about day 4 and 5 and only took the antiemetic once or twice.  He did not realize what the allopurinol was for and only took this a few times.  I reviewed with him and his wife that this was for tumor lysis prevention and now that his nodes have all resolved on exam he will not need to be on this any longer but plan had been for him to be on this daily for prevention.    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  ?  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  ?  He has noted some mild adenopathy in bilateral neck regions for approximately 2 years and over the last 8 months has noted an increase in adenopathy as well as some inguinal adenopathy left greater than right.  He denies any fevers sweats pruritus chills or weight loss but his wife Alejandro Robinson does indicate he has experienced perhaps some sweats and some fatigue.  The nodes are not painful.  In general he feels well.  He denies any anorexia or weight loss.  ?  He presented to his local nurse practitioner [name], FNP at the [facility] clinic recently and underwent lab work 07/23/2020. ?CBC, CMP, and PSA were normal.  07/25/2020 ultrasound did show enlarged bilateral cervical adenopathy.  ?  08/05/2020 CT scan showed extensive bilateral cervical supraclavicular and mediastinal adenopathy with leading consideration being lymphoma.  ?  08/12/2020 PET scan performed at [facility] does show diffuse adenopathy of the Ballentine tonsils with SUV of 8.25, extensive bilateral cervical chain adenopathy with lower SUVs of 2.9, 2.2, and 1.8, significant abnormal mediastinal and axillary adenopathy, abnormal retroperitoneal pelvic sidewall external iliac and inguinal adenopathy. ?There is no splenomegaly.  ?  09/24/20 cycle 1 rituximab, bendamustine.  10/23/20 cycle 2 rituxan, bendamustine  ?       Review of Systems  He had just some mild fatigue and constipation about day 4 and 5.  No nausea and vomiting issues.  He is feeling great.  No infectious symptomatology.  Constitutional: Negative for fever and chills.   HENT: Negative for ear pain, sore throat, mouth sores, trouble swallowing, neck pain and sinus pressure.    Eyes: Negative for  visual disturbance.   Respiratory: Negative for shortness of breath.    Cardiovascular: Negative for chest pain.   Gastrointestinal: Negative for nausea, diarrhea,  and blood in stool.   Genitourinary: Negative for dysuria, urgency, frequency, difficulty urinating and dyspareunia.   Musculoskeletal: Negative.    Neurological: Negative for weakness, numbness and headaches.   Hematological: Does not bruise/bleed easily.   Psychiatric/Behavioral: Negative for confusion and decreased concentration. The patient is not nervous/anxious.        Objective:         ? multivitamin (MULTIPLE VITAMIN PO) Take  by mouth.   ? ondansetron HCL (ZOFRAN) 8 mg tablet Take 2 tablets by mouth daily on Days 3 and 4 of each cycle, then take 1 tablet every 8 hours as needed for nausea and vomiting.   ? other medication 1 Dose. Ageless Male 2 daily   ? other medication 1 Dose. Super Beta Prostate     Vitals:    10/23/20 0812   BP: 124/67   BP Source: Arm, Right Upper   Patient Position: Sitting   Pulse: 64   Resp: 18   Temp: 36.4 ?C (97.5 ?F)   TempSrc: Temporal   SpO2: 100%   Weight: 90.5 kg (199 lb 9.6 oz)   PainSc: Zero     Body mass index is 26.03 kg/m?[name]     Pain Score: Zero       Fatigue Scale: 0-None    Pain Addressed:  N/A    Patient Evaluated for a Clinical Trial: No treatment clinical trial available for this patient.     [place] Cooperative Oncology Group performance status is 0, Fully active, able to carry on all pre-disease performance without restriction.[name]     Physical Exam     Very pleasant conversant Caucasian male in no distress.  HEENT: Slightly ruddy facial complexion with slight rash in both temporal regions chronically.  He is wearing a facemask.  Neck: All of the very bulky cervical supraclavicular adenopathy has resolved.  Axilla: The previous bilateral enlarged rubbery axillary nodes have all resolved.  Extremities: No abnormalities.  Neuro: Alert and oriented x3.     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 ?He initiated therapy with rituxan and bendamustine on 09/24/20.  Tolerating well thus far.  Due for cycle 2-day and tomorrow. Follow up with cycle 3.  Plan 6 cycles of tx.    Very rapid response to treatment with resolution of bulky adenopathy.  CBC looks great.  Other labs pending.  ?  Drug induced insomnia (HCC)  Likely due to steroids. Only lasted a few nights around treatment.   May try Melatonin or Tylenol PM if he wants.    ?  Drug induced constipation  Likely due to treatment, possibly zofran.  He did take stool softener and had relief.  Instructed to take stool softener on day 1 and 2 (chemo days) to see if that helps with constipation.    Is doing great and is on the stool softener and plans to add MiraLAX as needed.  ?  ?  Patient verbalized understanding and all questions were answered to their satisfaction.  Follow up with cycle 3  ?  Time spent reviewing previous notes, records and labs as well as face to face with patient, documentation, entering orders and coordination of care was?25 minutes.    This note is partially generated using voice recognition software.  Please excuse any  typographical errors.

## 2020-10-23 NOTE — Progress Notes
CHEMO NOTE  Verified chemo consent signed and in chart.    Verified initiate chemo order in O2    Blood return positive via: Peripheral (22 ga)    BSA and dose double checked (agree with orders as written) with: yes Malena Edman, RN    Labs/applicable tests checked: CBC and Comprehensive Metabolic Panel (CMP)    Chemo regime: Drug/cycle/day Rituximab 840m and Bendeka 2084mC2D1    Rate verified and armband double checkwith second RN: yes JeBaltazar NajjarRN and KaSerita ShellerRN    Patient education offered and stated understanding. Denies questions at this time.    Pt here for C2D1 Rituximab and Bendeka. OK to treat per Dr. ShEvangeline GulaTreatment parameters met. PIV placed in left forearm, positive blood return noted. Pt states after treatment he had some constipation, pt is taking Miralax to alleviate this, no other concerns voiced. Pre-meds given prior to treatment. First rapid infusion of Rituximab today, infusion given as ordered, pt tolerated without reaction. Bendeka infused as ordered, without incident. Post treatment, IV flushed with saline, then removed, compression dressing applied. Pt discharged in stable condition. To return tomorrow for C2D2.

## 2020-10-23 NOTE — Patient Instructions
[facility]  Chemotherapy Instructions    Alejandro Robinson 10/23/2020    Chemotherapy Drugs:    Rituxan and Bendeka    Call Immediately to report the following:  Unexplained bleeding or bleeding that will not stop  Difficulty swallowing  Shortness of breath, wheezing, or trouble breathing  Rapid, irregular heartbeat; chest pain  Dizziness, lightheadedness  Rash or cut that swells or turns red, feels hot or painful, or begin to ooze  Diarrhea   Uncontrolled nausea or vomiting  Fever of 100.4 F or higher, or chills    Important Phone Numbers:  [place] Number (answered 24 hours a day) [place] (appointments) [phone]  Social Worker [phone]

## 2020-10-24 ENCOUNTER — Encounter: Admit: 2020-10-24 | Discharge: 2020-10-24 | Payer: BC Managed Care – PPO | Primary: Primary Care

## 2020-10-24 DIAGNOSIS — C880 Waldenstrom macroglobulinemia: Secondary | ICD-10-CM

## 2020-10-24 MED ORDER — ONDANSETRON 8 MG PO TBDI
16 mg | Freq: Once | ORAL | 0 refills | Status: CP
Start: 2020-10-24 — End: ?

## 2020-10-24 MED ORDER — DEXAMETHASONE 6 MG PO TAB
12 mg | Freq: Once | ORAL | 0 refills | Status: CP
Start: 2020-10-24 — End: ?

## 2020-10-24 MED ORDER — BENDAMUSTINE (BENDEKA) IVPB
90 mg/m2 | Freq: Once | INTRAVENOUS | 0 refills | Status: CP
Start: 2020-10-24 — End: ?

## 2020-10-24 NOTE — Progress Notes
CHEMO NOTE  Verified chemo consent signed and in chart.    Verified initiate chemo order in O2    Blood return positive via: Peripheral (22 ga)    BSA and dose double checked (agree with orders as written) with: yes   E. Coffman RN    Labs/applicable tests checked: None    Chemo regime: Drug/cycle/day   C2D2  bendamustine (BENDEKA) 200 mg in sodium chloride 0.9% (NS) 58 mL IVPB     Rate verified and armband double check with second RN: yes    E. Coffman RN    Patient education offered and stated understanding. Denies questions at this time.      Pt here for chemo. Pt voices no complaints. IV started in right forearm. Premeds given. Bendamustine given as ordered. Pt tolerated infusion well. IV removed and pt discharged to home in stable condition.

## 2020-10-24 NOTE — Patient Instructions
[facility]  Discharge Instructions      Alejandro Robinson  10/24/2020    Treatment Received Today:  Bendamustine            Discharge Instructions  Call immediately to report the following:  ? Uncontrolled nausea or vomiting, pain, or bleeding  ? Temperature of 100.5 F or greater or any sign/symptom of infection (warmth, redness, tenderness)  ? Painful mouth or difficulty swallowing  ? Diarrhea   ? Swelling of arms or legs  ? Rash     Post-Treatment Directions:  ? Use mouth rinses after meals and at bedtime.  Use a non-alcohol commercial brand rinse   or mild salt water/baking soda rinse.    ? Drink 8-10 glasses of fluids daily.  ? Try to exercise daily to decrease fatigue.      Medication Instructions  If there are any specific medication instructions they are written below          Phone Numbers  Cancer Center Phone # [phone]    (Answered 24 hrs a day)      Return Appointments:      Hospital Outpatient Visit on 10/23/2020   Component Date Value Ref Range Status   ? White Blood Cells 10/23/2020 5.8  4.5 - 11.0 K/UL Final   ? RBC 10/23/2020 4.72  4.4 - 5.5 M/UL Final   ? Hemoglobin 10/23/2020 13.9  13.5 - 16.5 GM/DL Final   ? Hematocrit 09/81/1914 41.6  40 - 50 % Final   ? MCV 10/23/2020 88.0  80 - 100 FL Final   ? MCH 10/23/2020 29.4  26 - 34 PG Final   ? MCHC 10/23/2020 33.5  32.0 - 36.0 G/DL Final   ? RDW 78/29/5621 13.9  11 - 15 % Final   ? Platelet Count 10/23/2020 187  150 - 400 K/UL Final   ? MPV 10/23/2020 7.7  7 - 11 FL Final   ? Neutrophils 10/23/2020 70  41 - 77 % Final   ? Lymphocytes 10/23/2020 11 (A) 24 - 44 % Final   ? Monocytes 10/23/2020 14 (A) 4 - 12 % Final   ? Eosinophils 10/23/2020 4  0 - 5 % Final   ? Basophils 10/23/2020 1  0 - 2 % Final   ? Absolute Neutrophil Count 10/23/2020 4.00  1.8 - 7.0 K/UL Final   ? Absolute Lymph Count 10/23/2020 0.70 (A) 1.0 - 4.8 K/UL Final   ? Absolute Monocyte Count 10/23/2020 0.80  0 - 0.80 K/UL Final   ? Absolute Eosinophil Count 10/23/2020 0.30  0 - 0.45 K/UL Final   ? Absolute Basophil Count 10/23/2020 0.10  0 - 0.20 K/UL Final   ? Sodium 10/23/2020 143  137 - 147 MMOL/L Final   ? Potassium 10/23/2020 4.9  3.5 - 5.1 MMOL/L Final   ? Chloride 10/23/2020 105  98 - 110 MMOL/L Final   ? Glucose 10/23/2020 92  70 - 100 MG/DL Final   ? Blood Urea Nitrogen 10/23/2020 19  7 - 25 MG/DL Final   ? Creatinine 30/86/5784 0.96  0.4 - 1.24 MG/DL Final   ? Calcium 69/62/9528 9.8  8.5 - 10.6 MG/DL Final   ? Total Protein 10/23/2020 6.6  6.0 - 8.0 G/DL Final   ? Total Bilirubin 10/23/2020 0.4  0.3 - 1.2 MG/DL Final   ? Albumin 41/32/4401 4.4  3.5 - 5.0 G/DL Final   ? Alk Phosphatase 10/23/2020 40  25 - 110 U/L Final   ?  AST (SGOT) 10/23/2020 27  7 - 40 U/L Final   ? CO2 10/23/2020 30  21 - 30 MMOL/L Final   ? ALT (SGPT) 10/23/2020 22  7 - 56 U/L Final   ? Anion Gap 10/23/2020 8  3 - 12 Final   ? eGFR 10/23/2020 >60  >60 mL/min Final    eGFR calculated using the CKD-EPIcr_R equation

## 2020-11-15 NOTE — Assessment & Plan Note
He has history of stage IV Charlcie Cradle macroglobulinemia with diffuse adenopathy and marrow involvement.  He continues on treatment with Rituxan and bendamustine and is due today for consideration of cycle 3.  Overall continues to tolerate treatment very well.  He has had an excellent response with resolution of bulky adenopathy.  CBC as above, CMP is pending.  We will go ahead and proceed with treatment today.  He will follow -up with provider with his next cycle of treatment.  Overall plan is for him to complete a total of 6 cycles.

## 2020-11-15 NOTE — Progress Notes
Name: Alejandro Robinson          MRN: [id number]      DOB: 12/14/1965      AGE: 55 y.o.   DATE OF SERVICE: 11/20/2020    Subjective:             Reason for Visit:  Heme/Onc Care      Alejandro Robinson is a 55 y.o. male.       History of Present Illness  This is a patient of Dr. Willette Robinson who presents today for follow-up of Waldenstr?m's macroglobulinemia. He is unaccompanied in the clinic today.  He is here for labs, follow-up and consideration of cycle 3  of Rituxan and bendamustine.    Overall, really doing very well.  He is tolerating treatment well.  He is in trouble sleeping generally the night of treatment.  Has had some mild constipation.  Does have some tiredness that last for about 3 days after his treatment.  No fevers or signs of infection.  He is eating and drinking well.      History:    Waldenstrom's macroglobulinemia     Referred by Alejandro Piedra, FNP for hematology oncology consultation due to diffuse abnormal lymphadenopathy.  ?  He has noted some mild adenopathy in bilateral neck regions for approximately 2 years and over the last 8 months has noted an increase in adenopathy as well as some inguinal adenopathy left greater than right.  He denies any fevers sweats pruritus chills or weight loss but his wife Alejandro Robinson does indicate he has experienced perhaps some sweats and some fatigue.  The nodes are not painful.  In general he feels well.  He denies any anorexia or weight loss.  ?  He presented to his local nurse practitioner Alejandro Piedra, FNP at the [facility] clinic recently and underwent lab work 07/23/2020. ?CBC, CMP, and PSA were normal.  07/25/2020 ultrasound did show enlarged bilateral cervical adenopathy.  ?  08/05/2020 CT scan showed extensive bilateral cervical supraclavicular and mediastinal adenopathy with leading consideration being lymphoma.  ?  08/12/2020 PET scan performed at [facility] does show diffuse adenopathy of the Ballentine tonsils with SUV of 8.25, extensive bilateral cervical chain adenopathy with lower SUVs of 2.9, 2.2, and 1.8, significant abnormal mediastinal and axillary adenopathy, abnormal retroperitoneal pelvic sidewall external iliac and inguinal adenopathy. ?There is no splenomegaly.  ?  09/24/20 cycle 1 rituximab, bendamustine.  10/23/20 cycle 2 rituxan, bendamustine    11/20/20: C3 Rituxan/Bendamustine    Alejandro Robinson has no past medical history on file.    He has a past surgical history that includes hand surgery (Left) and knee surgery (Left).    Alejandro Robinson's family history includes Cancer-Breast in his mother; Cancer-Prostate in his paternal grandfather.    Alejandro Robinson reports that he has never smoked. His smokeless tobacco use includes snuff. He reports current alcohol use of about 5.0 standard drinks of alcohol per week. He reports previous drug use.         Review of Systems   Constitutional: Negative.  Negative for appetite change, chills, fatigue and fever.   HENT: Negative.  Negative for mouth sores, sore throat and trouble swallowing.    Eyes: Negative.  Negative for visual disturbance.   Respiratory: Negative.  Negative for cough and shortness of breath.    Cardiovascular: Negative.  Negative for chest pain, palpitations and leg swelling.   Gastrointestinal: Negative.  Negative for abdominal pain, constipation, diarrhea, nausea and vomiting.   Endocrine:  Negative.    Genitourinary: Negative.  Negative for difficulty urinating.   Musculoskeletal: Negative.    Skin: Negative.    Neurological: Negative.  Negative for dizziness and numbness.   Hematological: Negative for adenopathy. Does not bruise/bleed easily.   Psychiatric/Behavioral: Negative.  Negative for sleep disturbance.         Objective:         ? multivitamin (MULTIPLE VITAMIN PO) Take  by mouth.   ? ondansetron HCL (ZOFRAN) 8 mg tablet Take 2 tablets by mouth daily on Days 3 and 4 of each cycle, then take 1 tablet every 8 hours as needed for nausea and vomiting.   ? other medication 1 Dose. Ageless Male 2 daily   ? other medication 1 Dose. Super Beta Prostate     Vitals:    11/20/20 0814   BP: 131/87   BP Source: Arm, Right Upper   Pulse: 58   Temp: 36.1 ?C (96.9 ?F)   Resp: 16   SpO2: 100%   TempSrc: Temporal   PainSc: Zero   Weight: 92.1 kg (203 lb)     Body mass index is 26.62 kg/m?[name]     Pain Score: Zero       Fatigue Scale: 0-None    Pain Addressed:  N/A    Patient Evaluated for a Clinical Trial: No treatment clinical trial available for this patient.     [place] Cooperative Oncology Group performance status is 1, Restricted in physically strenuous activity but ambulatory and able to carry out work of a light or sedentary nature, e.g., light house work, office work.     Physical Exam  Vitals reviewed.   Constitutional:       General: He is not in acute distress.     Appearance: He is well-developed.   HENT:      Head: Normocephalic and atraumatic.      Nose: Nose normal.      Mouth/Throat:      Pharynx: No oropharyngeal exudate.   Eyes:      General: No scleral icterus.        Right eye: No discharge.         Left eye: No discharge.      Conjunctiva/sclera: Conjunctivae normal.      Pupils: Pupils are equal, round, and reactive to light.   Cardiovascular:      Rate and Rhythm: Normal rate and regular rhythm.      Heart sounds: No murmur heard.  Pulmonary:      Effort: Pulmonary effort is normal. No respiratory distress.      Breath sounds: Normal breath sounds. No wheezing.   Abdominal:      General: Bowel sounds are normal. There is no distension.      Palpations: Abdomen is soft.      Tenderness: There is no abdominal tenderness.      Comments: No hepatosplenomegaly.    Musculoskeletal:         General: Normal range of motion.      Cervical back: Normal range of motion and neck supple.   Lymphadenopathy:      Cervical: No cervical adenopathy.   Skin:     General: Skin is warm and dry.      Findings: No rash.   Neurological:      Mental Status: He is alert and oriented to person, place, and time.   Psychiatric: Behavior: Behavior normal.         Thought Content: Thought content  normal.         Judgment: Judgment normal.          CBC w diff    Lab Results   Component Value Date/Time    WBC 4.7 11/20/2020 08:14 AM    RBC 4.95 11/20/2020 08:14 AM    HGB 14.8 11/20/2020 08:14 AM    HCT 43.5 11/20/2020 08:14 AM    MCV 87.9 11/20/2020 08:14 AM    MCH 29.8 11/20/2020 08:14 AM    MCHC 33.9 11/20/2020 08:14 AM    RDW 14.0 11/20/2020 08:14 AM    PLTCT 240 11/20/2020 08:14 AM    MPV 7.6 11/20/2020 08:14 AM    Lab Results   Component Value Date/Time    NEUT 62 11/20/2020 08:14 AM    ANC 2.90 11/20/2020 08:14 AM    LYMA 12 (L) 11/20/2020 08:14 AM    ALC 0.60 (L) 11/20/2020 08:14 AM    MONA 19 (H) 11/20/2020 08:14 AM    AMC 0.90 (H) 11/20/2020 08:14 AM    EOSA 6 (H) 11/20/2020 08:14 AM    AEC 0.30 11/20/2020 08:14 AM    BASA 1 11/20/2020 08:14 AM    ABC 0.00 11/20/2020 08:14 AM             Assessment and Plan:    Problem   Fatigue Due to Exposure   Drug Induced Insomnia (Hcc)   Drug Induced Constipation   Waldenstrom Macroglobulinemia (Hcc)       Waldenstrom macroglobulinemia (HCC)  He has history of stage IV [place] macroglobulinemia with diffuse adenopathy and marrow involvement.  He continues on treatment with Rituxan and bendamustine and is due today for consideration of cycle 3.  Overall continues to tolerate treatment very well.  He has had an excellent response with resolution of bulky adenopathy.  CBC as above, CMP is pending.  We will go ahead and proceed with treatment today.  He will follow -up with provider with his next cycle of treatment.  Overall plan is for him to complete a total of 6 cycles.      Drug induced insomnia (HCC)  Secondary to steroids.  We did discuss he could use some over-the-counter melatonin or Benadryl if needed.    Drug induced constipation  Secondary to treatment/antiemetics.  He uses MiraLAX for few days around his treatment which is been quite helpful.    Fatigue due to exposure  He does couple days of tiredness after each treatment but overall seems to do extremely well.  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  He thankfully does have help from his family.        The above assessment and plan was reviewed with the patient and he verbalizes understanding and questions were answered to their satisfaction. he has contact information for the clinic and knows to call with any worsening symptoms or any questions or concerns.     Time spent reviewing previous notes, records and labs as well as face to face with patient, documentation, entering orders and coordination of care was 30 minutes.     This note is partially generated using voice recognition software. Please excuse any typographical errors.

## 2020-11-19 ENCOUNTER — Encounter: Admit: 2020-11-19 | Discharge: 2020-11-19 | Payer: BC Managed Care – PPO | Primary: Primary Care

## 2020-11-20 ENCOUNTER — Encounter: Admit: 2020-11-20 | Discharge: 2020-11-20 | Payer: BC Managed Care – PPO | Primary: Primary Care

## 2020-11-20 DIAGNOSIS — F19982 Other psychoactive substance use, unspecified with psychoactive substance-induced sleep disorder: Secondary | ICD-10-CM

## 2020-11-20 DIAGNOSIS — C880 Waldenstrom macroglobulinemia: Secondary | ICD-10-CM

## 2020-11-20 DIAGNOSIS — T732XXD Exhaustion due to exposure, subsequent encounter: Secondary | ICD-10-CM

## 2020-11-20 DIAGNOSIS — K5903 Drug induced constipation: Secondary | ICD-10-CM

## 2020-11-20 MED ORDER — ONDANSETRON 8 MG PO TBDI
16 mg | Freq: Once | ORAL | 0 refills | Status: CP
Start: 2020-11-20 — End: ?
  Administered 2020-11-20: 14:00:00 16 mg via ORAL

## 2020-11-20 MED ORDER — ACETAMINOPHEN 325 MG PO TAB
650 mg | Freq: Once | ORAL | 0 refills | Status: CP
Start: 2020-11-20 — End: ?
  Administered 2020-11-20: 14:00:00 650 mg via ORAL

## 2020-11-20 MED ORDER — BENDAMUSTINE (BENDEKA) IVPB
90 mg/m2 | Freq: Once | INTRAVENOUS | 0 refills | Status: CN
Start: 2020-11-20 — End: ?

## 2020-11-20 MED ORDER — RITUXIMAB-PVVR IVPB (SPEC VOL)
375 mg/m2 | Freq: Once | INTRAVENOUS | 0 refills | Status: CP
Start: 2020-11-20 — End: ?
  Administered 2020-11-20 (×3): 800 mg via INTRAVENOUS

## 2020-11-20 MED ORDER — BENDAMUSTINE (BENDEKA) IVPB
90 mg/m2 | Freq: Once | INTRAVENOUS | 0 refills | Status: CP
Start: 2020-11-20 — End: ?
  Administered 2020-11-20 (×2): 200 mg via INTRAVENOUS

## 2020-11-20 MED ORDER — DEXAMETHASONE 6 MG PO TAB
12 mg | Freq: Once | ORAL | 0 refills | Status: CP
Start: 2020-11-20 — End: ?
  Administered 2020-11-20: 14:00:00 12 mg via ORAL

## 2020-11-20 MED ORDER — DIPHENHYDRAMINE HCL 25 MG PO CAP
25 mg | Freq: Once | ORAL | 0 refills | Status: CP
Start: 2020-11-20 — End: ?
  Administered 2020-11-20: 14:00:00 25 mg via ORAL

## 2020-11-20 NOTE — Progress Notes
CHEMO NOTE  Verified chemo consent signed and in chart.    Verified initiate chemo order in O2    Blood return positive via: Peripheral (22 ga)    BSA and dose double checked (agree with orders as written) with: yes Georgette Dover, RN    Labs/applicable tests checked: CBC and Comprehensive Metabolic Panel (CMP)    Chemo regime: Drug/cycle/day Rituximab 800mg  and Bendeka 200mg  C2D1    Rate verified and armband double checkwith second RN: yes Langley Gauss, RN; Trisha Mangle, RN; and Serita Sheller, RN    Patient education offered and stated understanding. Denies questions at this time.    Pt here for C3D1 Rituximab and Bendeka. OK to treat per NP [place]kwiler. Treatment parameters met. PIV placed in left forearm, positive blood return noted. No concerns voiced. Pre-meds given prior to treatment.  Rituximab and Bendeka infused as ordered, without incident. Post treatment, IV flushed with saline, then removed, compression dressing applied. Pt discharged in stable condition. To return tomorrow for C3D2.

## 2020-11-20 NOTE — Patient Instructions
[facility]  Chemotherapy Instructions    Alejandro Robinson 11/20/2020    Chemotherapy Drugs:    Rituximab and Bendeka    Call Immediately to report the following:  Unexplained bleeding or bleeding that will not stop  Difficulty swallowing  Shortness of breath, wheezing, or trouble breathing  Rapid, irregular heartbeat; chest pain  Dizziness, lightheadedness  Rash or cut that swells or turns red, feels hot or painful, or begin to ooze  Diarrhea   Uncontrolled nausea or vomiting  Fever of 100.4 F or higher, or chills    Important Phone Numbers:  [place] Number (answered 24 hours a day) [place] (appointments) [phone]  Social Worker [phone]

## 2020-11-21 ENCOUNTER — Encounter: Admit: 2020-11-21 | Discharge: 2020-11-21 | Payer: BC Managed Care – PPO | Primary: Primary Care

## 2020-11-21 DIAGNOSIS — C880 Waldenstrom macroglobulinemia: Secondary | ICD-10-CM

## 2020-11-21 MED ORDER — BENDAMUSTINE (BENDEKA) IVPB
90 mg/m2 | Freq: Once | INTRAVENOUS | 0 refills | Status: CP
Start: 2020-11-21 — End: ?
  Administered 2020-11-21 (×2): 200 mg via INTRAVENOUS

## 2020-11-21 MED ORDER — ONDANSETRON 8 MG PO TBDI
16 mg | Freq: Once | ORAL | 0 refills | Status: CP
Start: 2020-11-21 — End: ?
  Administered 2020-11-21: 19:00:00 16 mg via ORAL

## 2020-11-21 MED ORDER — DEXAMETHASONE 6 MG PO TAB
12 mg | Freq: Once | ORAL | 0 refills | Status: CP
Start: 2020-11-21 — End: ?
  Administered 2020-11-21: 19:00:00 12 mg via ORAL

## 2020-11-21 NOTE — Progress Notes
24g PIV placed in right hand, brisk blood return and flushed without discomfort.  Pt tolerated treatment well, with no complaints or signs of reaction.  At end of infusion, PIV flushed with NS and discontinued.  Pt ambulated independently out of clinic.    CHEMO NOTE  Verified chemo consent signed and in chart.    Blood return positive via: Peripheral (24 ga)    BSA and dose double checked (agree with orders as written) with: yes R. Sonny Masters, RN    Labs/applicable tests checked: CBC and Comprehensive Metabolic Panel (CMP)    Chemo regimen: Drug/cycle/day  Cycle 3, Day 2 Bendamustine (SEE MAR)    Rate verified and armband double check with second RN: yes, SEE MAR    Patient education offered and stated understanding. Denies questions at this time.

## 2020-11-21 NOTE — Patient Instructions
For up to date information on the COVID-19 virus, visit the CDC website. https://www.cdc.gov/coronavirus   General supportive care during cold and flu season and infection prevention reminders:    o Wash hands often with soap and water for at least 20 seconds   o Cover your mouth and nose   o Social distancing: try to maintain 6 feet between you and other people   o Stay home if sick and symptoms mild or manageable?   If you must be around people wear a mask     If you are having symptoms of a lower respiratory infection (cough, shortness of breath) and/or fever AND either traveled in last 30 days (internationally or to region of exposure) OR known exposure to patient with COVID19:     o Call your primary care provider for questions or health needs.    Tell your doctor about your recent travel and your symptoms     o In a medical emergency, call 911 or go to the nearest emergency room.

## 2020-11-24 ENCOUNTER — Encounter: Admit: 2020-11-24 | Discharge: 2020-11-24 | Payer: BC Managed Care – PPO | Primary: Primary Care

## 2020-11-24 MED ORDER — BENDAMUSTINE (BENDEKA) IVPB
90 mg/m2 | Freq: Once | INTRAVENOUS | 0 refills
Start: 2020-11-24 — End: ?

## 2020-12-18 ENCOUNTER — Encounter: Admit: 2020-12-18 | Discharge: 2020-12-18 | Payer: BC Managed Care – PPO | Primary: Primary Care

## 2020-12-18 DIAGNOSIS — C880 Waldenstrom macroglobulinemia: Secondary | ICD-10-CM

## 2020-12-18 DIAGNOSIS — K5903 Drug induced constipation: Secondary | ICD-10-CM

## 2020-12-18 DIAGNOSIS — T732XXD Exhaustion due to exposure, subsequent encounter: Secondary | ICD-10-CM

## 2020-12-18 MED ORDER — DIPHENHYDRAMINE HCL 25 MG PO CAP
25 mg | Freq: Once | ORAL | 0 refills | Status: CP
Start: 2020-12-18 — End: ?
  Administered 2020-12-18: 14:00:00 25 mg via ORAL

## 2020-12-18 MED ORDER — RITUXIMAB-PVVR IVPB (SPEC VOL)
375 mg/m2 | Freq: Once | INTRAVENOUS | 0 refills | Status: CP
Start: 2020-12-18 — End: ?
  Administered 2020-12-18 (×3): 800 mg via INTRAVENOUS

## 2020-12-18 MED ORDER — DEXAMETHASONE 6 MG PO TAB
12 mg | Freq: Once | ORAL | 0 refills | Status: CP
Start: 2020-12-18 — End: ?
  Administered 2020-12-18: 14:00:00 12 mg via ORAL

## 2020-12-18 MED ORDER — ACETAMINOPHEN 325 MG PO TAB
650 mg | Freq: Once | ORAL | 0 refills | Status: CP
Start: 2020-12-18 — End: ?
  Administered 2020-12-18: 14:00:00 650 mg via ORAL

## 2020-12-18 MED ORDER — BENDAMUSTINE (BENDEKA) IVPB
90 mg/m2 | Freq: Once | INTRAVENOUS | 0 refills | Status: CP
Start: 2020-12-18 — End: ?
  Administered 2020-12-18 (×2): 200 mg via INTRAVENOUS

## 2020-12-18 MED ORDER — ONDANSETRON 8 MG PO TBDI
16 mg | Freq: Once | ORAL | 0 refills | Status: CP
Start: 2020-12-18 — End: ?
  Administered 2020-12-18: 14:00:00 16 mg via ORAL

## 2020-12-18 NOTE — Progress Notes
Patient here today for Rituxan and Bendeka treatment. Pt states no concerns. 22g IV to left forearm, flushed with saline, and BR noted. Ok to treat per Dr. Evangeline Gula. Premedications and infusions given with no complications per protocol. IV removed and pressure dressing applied. Pt left in stable condition.     CHEMO NOTE  Verified chemo consent signed and in chart.    Blood return positive via: Peripheral (22 ga)    BSA and dose double checked (agree with orders as written) with: yes Megan S RN    Labs/applicable tests checked: CBC and Comprehensive Metabolic Panel (CMP)    Chemo regimen: Drug/cycle/day D1C4  rituximab-pvvr (RUXIENCE) 800 mg in sodium chloride 0.9% (NS) 250 mL IVPB (rapid infusion)  bendamustine (BENDEKA) 200 mg in sodium chloride 0.9% (NS) 58 mL IVPB       Rate verified and armband double check with second RN: yes    Patient education offered and stated understanding. Denies questions at this time.

## 2020-12-18 NOTE — Progress Notes
Name: Alejandro Robinson          MRN: [id number]      DOB: 1966-03-06      AGE: 55 y.o.   DATE OF SERVICE: 12/18/2020    Subjective:             Reason for Visit:  Heme/Onc Care      Alejandro Robinson is a 55 y.o. male.       History of Present Illness  Alejandro Robinson is here today for treatment with Rituxan plus bendamustine course #4/6 for his recently diagnosed Waldenstrom macroglobulinemia.       He notes that all his bulky nodes resolved a few days after his first course of treatment.  He feels that he is tolerating the chemotherapy extremely well.  Does have some mild fatigue and nausea for about 3 days after treatment and then this completely resolves.  Denies any infectious symptomatology.    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  ?  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  ?  He has noted some mild adenopathy in bilateral neck regions for approximately 2 years and over the last 8 months has noted an increase in adenopathy as well as some inguinal adenopathy left greater than right.  He denies any fevers sweats pruritus chills or weight loss but his wife [name] does indicate he has experienced perhaps some sweats and some fatigue.  The nodes are not painful.  In general he feels well.  He denies any anorexia or weight loss.  ?  He presented to his local nurse practitioner [name], FNP at the [facility] clinic recently and underwent lab work 07/23/2020. ?CBC, CMP, and PSA were normal.  07/25/2020 ultrasound did show enlarged bilateral cervical adenopathy.  ?  08/05/2020 CT scan showed extensive bilateral cervical supraclavicular and mediastinal adenopathy with leading consideration being lymphoma.  ?  08/12/2020 PET scan performed at [facility] does show diffuse adenopathy of the Ballentine tonsils with SUV of 8.25, extensive bilateral cervical chain adenopathy with lower SUVs of 2.9, 2.2, and 1.8, significant abnormal mediastinal and axillary adenopathy, abnormal retroperitoneal pelvic sidewall external iliac and inguinal adenopathy. ?There is no splenomegaly.  ?  09/24/20 cycle 1 rituximab, bendamustine.  10/23/20 cycle 2 rituxan, bendamustine  ?       Review of Systems  He experiences some mild fatigue and constipation about day 4 and 5.  Trace nausea those days and then this completely resolves.  No infectious symptomatology.  Constitutional: Negative for fever and chills.   HENT: Negative for ear pain, sore throat, mouth sores, trouble swallowing, neck pain and sinus pressure.    Eyes: Negative for visual disturbance.   Respiratory: Negative for shortness of breath.    Cardiovascular: Negative for chest pain.   Gastrointestinal: Negative for diarrhea and blood in stool.   Genitourinary: Negative for dysuria, urgency, frequency, difficulty urinating and dyspareunia.   Musculoskeletal: Negative.    Neurological: Negative for weakness, numbness and headaches.   Hematological: Does not bruise/bleed easily.   Psychiatric/Behavioral: Negative for confusion and decreased concentration. The patient is not nervous/anxious.        Objective:         ? multivitamin (MULTIPLE VITAMIN PO) Take  by mouth.   ? ondansetron HCL (ZOFRAN) 8 mg tablet Take 2  tablets by mouth daily on Days 3 and 4 of each cycle, then take 1 tablet every 8 hours as needed for nausea and vomiting.   ? other medication 1 Dose. Ageless Male 2 daily   ? other medication 1 Dose. Super Beta Prostate     Vitals:    12/18/20 0826   BP: 114/64   BP Source: Arm, Left Upper   Pulse: 60   Temp: 36.4 ?C (97.5 ?F)   Resp: 20   SpO2: 100%   TempSrc: Temporal   PainSc: Zero   Weight: 92.7 kg (204 lb 6.4 oz)   Height: 186.5 cm (6' 1.43)     Body mass index is 26.66 kg/m?[name]     Pain Score: Zero       Fatigue Scale: 0-None    Pain Addressed:  N/A    Patient Evaluated for a Clinical Trial: No treatment clinical trial available for this patient.     [place] Cooperative Oncology Group performance status is 0, Fully active, able to carry on all pre-disease performance without restriction.[name]     Physical Exam     Very pleasant conversant Caucasian male in no distress.  HEENT: Slightly ruddy facial complexion with slight rash in both temporal regions chronically.  He is wearing a facemask.  Neck: All of the very bulky cervical supraclavicular adenopathy has resolved.  Axilla: The previous bilateral enlarged rubbery axillary nodes have all resolved.  Extremities: No abnormalities.  Neuro: Alert and oriented x3.     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 ?He initiated therapy with rituxan and bendamustine on 09/24/20.  Tolerating well thus far.  Due for cycle 4 today and tomorrow. Plan 6 cycles of tx.    Very rapid response to treatment with resolution of bulky adenopathy.  CBC looks great.  Other labs pending.  Plan repeat PET scan and repeat bone marrow biopsy and lab work in July following his last course of treatment.  ?  Drug induced insomnia (HCC)  Likely due to steroids. Only lasted a few nights around treatment.   May try Melatonin or Tylenol PM if he wants.    ?  Drug induced constipation  Likely due to treatment, possibly zofran.  He did take stool softener and had relief.  Instructed to take stool softener on day 1 and 2 (chemo days) to see if that helps with constipation.    Is doing great and is on the stool softener and plans to add MiraLAX as needed.  ?  ?  Patient verbalized understanding and all questions were answered to their satisfaction.   ?  Time spent reviewing previous notes, records and labs as well as face to face with patient, documentation, entering orders and coordination of care was?25 minutes.    This note is partially generated using voice recognition software.  Please excuse any typographical errors.

## 2020-12-18 NOTE — Patient Instructions
[place]  Chemotherapy Instructions      Chemotherapy Drugs:    Bendeka and Rituxan    Scheduled Medications:      Other:     As Needed Medications:      Other:     Call Immediately to report the following:  Uncontrolled nausea and/or vomiting, uncontrolled pain, or unusual bleeding.  Temperature of 100.5 F or greater and/or any sign/symptom of infection (redness, warmth, tenderness)  Painful mouth or difficulty swallowing  Red, cracked, or painful hands and/or feet  Diarrhea   Swelling of arms or legs  Rash  Other:    Important Phone Numbers:  [place] Number (answered 24 hours a day) [address] [place] (appointments)[phone]  Social Worker Anice Paganini [phone]  Nutritionist  [phone]  [facility] (for nutritional supplements) [phone]

## 2020-12-19 ENCOUNTER — Encounter: Admit: 2020-12-19 | Discharge: 2020-12-19 | Payer: BC Managed Care – PPO | Primary: Primary Care

## 2020-12-19 DIAGNOSIS — C880 Waldenstrom macroglobulinemia: Secondary | ICD-10-CM

## 2020-12-19 MED ORDER — ONDANSETRON 8 MG PO TBDI
16 mg | Freq: Once | ORAL | 0 refills | Status: CP
Start: 2020-12-19 — End: ?
  Administered 2020-12-19: 19:00:00 16 mg via ORAL

## 2020-12-19 MED ORDER — DEXAMETHASONE 6 MG PO TAB
12 mg | Freq: Once | ORAL | 0 refills | Status: CP
Start: 2020-12-19 — End: ?
  Administered 2020-12-19: 19:00:00 12 mg via ORAL

## 2020-12-19 MED ORDER — BENDAMUSTINE (BENDEKA) IVPB
90 mg/m2 | Freq: Once | INTRAVENOUS | 0 refills | Status: CP
Start: 2020-12-19 — End: ?
  Administered 2020-12-19 (×2): 200 mg via INTRAVENOUS

## 2020-12-19 NOTE — Patient Instructions
.  [facility]  Chemotherapy Instructions    Alejandro Robinson 12/19/2020    Chemotherapy Drugs:  Bendeka    Call Immediately to report the following:  Unexplained bleeding or bleeding that will not stop  Difficulty swallowing  Shortness of breath, wheezing, or trouble breathing  Rapid, irregular heartbeat; chest pain  Dizziness, lightheadedness  Rash or cut that swells or turns red, feels hot or painful, or begin to ooze  Diarrhea   Uncontrolled nausea or vomiting  Fever of 100.4 F or higher, or chills    Important Phone Numbers:  [facility] Main Number (answered 24 hours a day) [phone]  [facility] Scheduling (appointments) [phone]  Social Worker [phone]    .For up-to-date information on COVID-19, visit the [facility] website. [email or web address]   General supportive care during cold and flu season and infection prevention reminders:   o Wash hands often with soap and water for at least 20 seconds or use hand sanitizer with at least 60% alcohol.   o Cover your mouth and nose.  o Practice social distancing: Try to maintain 6 feet between you and people outside your household.  o Avoid crowds and poorly ventilated spaces.  o Stay home if sick and symptoms are mild or manageable. If you must be around other people, wear a mask and practice social distancing.      If you are having symptoms of a lower respiratory infection (cough, shortness of breath) and/or fever or have been exposed to someone with COVID-19:    o Call your primary care provider for questions or health needs.   - Tell your doctor about your recent travel and your symptoms          In a medical emergency, call 911 or go to the nearest emergency room.

## 2020-12-19 NOTE — Progress Notes
CHEMO NOTE  Verified chemo consent signed and in chart.    Blood return positive via: Port (Single)    BSA and dose double checked (agree with orders as written) with: yes with Lequita Asal., RN chemo certified nurse    Labs/applicable tests checked: CBC and Comprehensive Metabolic Panel (CMP)    Chemo regimen: C4D2  bendamustine (BENDEKA) 200 mg in sodium chloride 0.9% (NS) 58 mL IVPBOrdered Dose: 90 mg/m2  2.16 m2 (Treatment Plan Recorded) : Admin Dose: 200 mg : 348 mL/hr : Intravenous : ONCE    Rate verified and armband double check with second RN: yes with Lady Saucier., RN    Patient education offered and stated understanding. Denies questions at this time.    Patient presents for Bendeka infusion. 22G peripheral IV inserted to RFA x1. Pre-medications administered. Infusion tolerated well. IV removed. Patient left ambulatory and at baseline condition.

## 2021-01-01 NOTE — Assessment & Plan Note
He does note a few days of fatigue after each treatment but then his energy returns.  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

## 2021-01-01 NOTE — Progress Notes
Name: Alejandro Robinson          MRN: [id number]      DOB: [birth year]      AGE: 55 y.o.   DATE OF SERVICE: 01/15/2021    Subjective:             Reason for Visit:  Heme/Onc Care      Alejandro Robinson is a 55 y.o. male.       History of Present Illness    This is a patient of Dr. Willette Robinson who presents today for follow-up of Waldenstr?m's macroglobulinemia. He is unaccompanied in the clinic today.  He is here for labs, follow-up and consideration of cycle 5 of Rituxan and bendamustine.    Overall, really doing very well.  He is tolerating treatment well.  He has trouble sleeping generally the night of treatment.  Has had some mild constipation.  Does have some tiredness that last for about 3 days after his treatment.  No fevers or signs of infection.  He is eating and drinking well.      History:    Waldenstrom's macroglobulinemia     Referred by Alejandro Piedra, FNP for hematology oncology consultation due to diffuse abnormal lymphadenopathy.  ?  He has noted some mild adenopathy in bilateral neck regions for approximately 2 years and over the last 8 months has noted an increase in adenopathy as well as some inguinal adenopathy left greater than right.  He denies any fevers sweats pruritus chills or weight loss but his wife Alejandro Robinson does indicate he has experienced perhaps some sweats and some fatigue.  The nodes are not painful.  In general he feels well.  He denies any anorexia or weight loss.  ?  He presented to his local nurse practitioner Alejandro Piedra, FNP at the [facility] clinic recently and underwent lab work 07/23/2020. ?CBC, CMP, and PSA were normal.  07/25/2020 ultrasound did show enlarged bilateral cervical adenopathy.  ?  08/05/2020 CT scan showed extensive bilateral cervical supraclavicular and mediastinal adenopathy with leading consideration being lymphoma.  ?  08/12/2020 PET scan performed at [facility] does show diffuse adenopathy of the Ballentine tonsils with SUV of 8.25, extensive bilateral cervical chain adenopathy with lower SUVs of 2.9, 2.2, and 1.8, significant abnormal mediastinal and axillary adenopathy, abnormal retroperitoneal pelvic sidewall external iliac and inguinal adenopathy. ?There is no splenomegaly.  ?  09/24/20 cycle 1 rituximab, bendamustine.  10/23/20 cycle 2 rituxan, bendamustine    11/20/20: C3 Rituxan/Bendamustine    Alejandro Robinson has no past medical history on file.    He has a past surgical history that includes hand surgery (Left) and knee surgery (Left).    Alejandro Robinson's family history includes Cancer-Breast in his mother; Cancer-Prostate in his paternal grandfather.    Alejandro Robinson reports that he has never smoked. His smokeless tobacco use includes snuff. He reports current alcohol use of about 5.0 standard drinks of alcohol per week. He reports previous drug use.         Review of Systems   Constitutional: Negative.  Negative for appetite change, chills, fatigue and fever.   HENT: Negative.  Negative for mouth sores, sore throat and trouble swallowing.    Eyes: Negative.  Negative for visual disturbance.   Respiratory: Negative.  Negative for cough and shortness of breath.    Cardiovascular: Negative.  Negative for chest pain, palpitations and leg swelling.   Gastrointestinal: Negative.  Negative for abdominal pain, constipation, diarrhea, nausea and vomiting.   Endocrine:  Negative.    Genitourinary: Negative.  Negative for difficulty urinating.   Musculoskeletal: Negative.    Skin: Negative.    Neurological: Negative.  Negative for dizziness and numbness.   Hematological: Negative for adenopathy. Does not bruise/bleed easily.   Psychiatric/Behavioral: Negative.  Negative for sleep disturbance.         Objective:         ? multivitamin (MULTIPLE VITAMIN PO) Take  by mouth.   ? ondansetron HCL (ZOFRAN) 8 mg tablet Take 2 tablets by mouth daily on Days 3 and 4 of each cycle, then take 1 tablet every 8 hours as needed for nausea and vomiting.   ? other medication 1 Dose. Ageless Male 2 daily   ? other medication 1 Dose. Super Beta Prostate   ? prochlorperazine maleate (COMPAZINE) 10 mg tablet Take one tablet by mouth every 6 hours.     Vitals:    01/15/21 0813   BP: 137/79   BP Source: Arm, Right Upper   Pulse: 64   Temp: 36.1 ?C (97 ?F)   Resp: 16   SpO2: 100%   TempSrc: Temporal   PainSc: Zero   Weight: 92.6 kg (204 lb 3.2 oz)     Body mass index is 26.63 kg/m?[name]     Pain Score: Zero       Fatigue Scale: 0-None    Pain Addressed:  N/A    Patient Evaluated for a Clinical Trial: No treatment clinical trial available for this patient.     [place] Cooperative Oncology Group performance status is 1, Restricted in physically strenuous activity but ambulatory and able to carry out work of a light or sedentary nature, e.g., light house work, office work.     Physical Exam  Vitals reviewed.   Constitutional:       General: He is not in acute distress.     Appearance: He is well-developed.   HENT:      Head: Normocephalic and atraumatic.      Nose: Nose normal.      Mouth/Throat:      Pharynx: No oropharyngeal exudate.   Eyes:      General: No scleral icterus.        Right eye: No discharge.         Left eye: No discharge.      Conjunctiva/sclera: Conjunctivae normal.      Pupils: Pupils are equal, round, and reactive to light.   Cardiovascular:      Rate and Rhythm: Normal rate and regular rhythm.      Heart sounds: No murmur heard.  Pulmonary:      Effort: Pulmonary effort is normal. No respiratory distress.      Breath sounds: Normal breath sounds. No wheezing.   Chest:   Breasts:      Right: No axillary adenopathy or supraclavicular adenopathy.      Left: Supraclavicular adenopathy present. No axillary adenopathy.       Abdominal:      General: Bowel sounds are normal. There is no distension.      Palpations: Abdomen is soft.      Tenderness: There is no abdominal tenderness.      Comments: No hepatosplenomegaly.    Musculoskeletal:         General: Normal range of motion.      Cervical back: Normal range of motion and neck supple.   Lymphadenopathy:      Cervical: No cervical adenopathy.      Upper  Body:      Right upper body: No supraclavicular or axillary adenopathy.      Left upper body: Supraclavicular adenopathy present. No axillary adenopathy.   Skin:     General: Skin is warm and dry.      Findings: No rash.   Neurological:      Mental Status: He is alert and oriented to person, place, and time.   Psychiatric:         Behavior: Behavior normal.         Thought Content: Thought content normal.         Judgment: Judgment normal.          CBC w diff    Lab Results   Component Value Date/Time    WBC 5.4 01/15/2021 08:04 AM    RBC 4.77 01/15/2021 08:04 AM    HGB 14.6 01/15/2021 08:04 AM    HCT 42.8 01/15/2021 08:04 AM    MCV 89.8 01/15/2021 08:04 AM    MCH 30.5 01/15/2021 08:04 AM    MCHC 34.0 01/15/2021 08:04 AM    RDW 14.2 01/15/2021 08:04 AM    PLTCT 211 01/15/2021 08:04 AM    MPV 7.6 01/15/2021 08:04 AM    Lab Results   Component Value Date/Time    NEUT 63 01/15/2021 08:04 AM    ANC 3.40 01/15/2021 08:04 AM    LYMA 16 (L) 01/15/2021 08:04 AM    ALC 0.90 (L) 01/15/2021 08:04 AM    MONA 16 (H) 01/15/2021 08:04 AM    AMC 0.90 (H) 01/15/2021 08:04 AM    EOSA 4 01/15/2021 08:04 AM    AEC 0.20 01/15/2021 08:04 AM    BASA 1 01/15/2021 08:04 AM    ABC 0.00 01/15/2021 08:04 AM             Assessment and Plan:    Problem   Cinv (Chemotherapy-Induced Nausea and Vomiting)   Fatigue Due to Exposure   Drug Induced Insomnia (Hcc)   Drug Induced Constipation   Waldenstrom Macroglobulinemia (Hcc)       Waldenstrom macroglobulinemia (HCC)  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  He continues on treatment with Rituxan and bendamustine and is due today for consideration of cycle 5.  Overall continues to tolerate treatment very well.  He has had an excellent response with almost complete resolution of bulky adenopathy-was able to palpate 1 small left supraclavicular lymph node on exam today.  CBC as above, CMP is pending.  We will go ahead and proceed with treatment today.  He will follow-up with provider with his next cycle of treatment.  Overall plan is for him to complete a total of 6 cycles.  After 6 cycles, he will have a repeat bone marrow biopsy and PET scan.     Drug induced insomnia (HCC)  Secondary to steroids.  He can use over-the-counter Benadryl as needed.    Drug induced constipation  Secondary to treatment.  He uses MiraLAX or stool softeners as needed with good effect.    Fatigue due to exposure  He does note a few days of fatigue after each treatment but then his energy returns.  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    CINV (chemotherapy-induced nausea and vomiting)  He does have a good amount of nausea Friday night, Saturday Sunday and Monday after his treatments.  He does use Zofran on an as-needed basis which does help some but  still deals with the nausea over the weekend.  We will go ahead and start him on scheduled Zofran starting Friday night to take twice a day through Monday evening and also called in prescription for Compazine to take as needed.  We did discuss the importance of taking the Compazine as soon as the nausea starts.        The above assessment and plan was reviewed with the patient and he verbalizes understanding and questions were answered to their satisfaction. he has contact information for the clinic and knows to call with any worsening symptoms or any questions or concerns.     Time spent reviewing previous notes, records and labs as well as face to face with patient, documentation, entering orders and coordination of care was 35 minutes.     This note is partially generated using voice recognition software. Please excuse any typographical errors.

## 2021-01-01 NOTE — Assessment & Plan Note
Secondary to treatment.  He uses MiraLAX or stool softeners as needed with good effect.

## 2021-01-15 ENCOUNTER — Encounter: Admit: 2021-01-15 | Discharge: 2021-01-15 | Payer: BC Managed Care – PPO | Primary: Primary Care

## 2021-01-15 DIAGNOSIS — K5903 Drug induced constipation: Secondary | ICD-10-CM

## 2021-01-15 DIAGNOSIS — C880 Waldenstrom macroglobulinemia: Secondary | ICD-10-CM

## 2021-01-15 DIAGNOSIS — T732XXD Exhaustion due to exposure, subsequent encounter: Secondary | ICD-10-CM

## 2021-01-15 DIAGNOSIS — R112 Nausea with vomiting, unspecified: Secondary | ICD-10-CM

## 2021-01-15 DIAGNOSIS — F19982 Other psychoactive substance use, unspecified with psychoactive substance-induced sleep disorder: Secondary | ICD-10-CM

## 2021-01-15 MED ORDER — PROCHLORPERAZINE MALEATE 10 MG PO TAB
10 mg | ORAL_TABLET | ORAL | 3 refills | Status: AC
Start: 2021-01-15 — End: ?

## 2021-01-15 MED ORDER — ACETAMINOPHEN 325 MG PO TAB
650 mg | Freq: Once | ORAL | 0 refills | Status: CP
Start: 2021-01-15 — End: ?
  Administered 2021-01-15: 14:00:00 650 mg via ORAL

## 2021-01-15 MED ORDER — DEXAMETHASONE 6 MG PO TAB
12 mg | Freq: Once | ORAL | 0 refills | Status: CP
Start: 2021-01-15 — End: ?
  Administered 2021-01-15: 14:00:00 12 mg via ORAL

## 2021-01-15 MED ORDER — DIPHENHYDRAMINE HCL 25 MG PO CAP
25 mg | Freq: Once | ORAL | 0 refills | Status: CP
Start: 2021-01-15 — End: ?
  Administered 2021-01-15: 14:00:00 25 mg via ORAL

## 2021-01-15 MED ORDER — ONDANSETRON 8 MG PO TBDI
16 mg | Freq: Once | ORAL | 0 refills | Status: CP
Start: 2021-01-15 — End: ?
  Administered 2021-01-15: 14:00:00 16 mg via ORAL

## 2021-01-15 MED ORDER — BENDAMUSTINE (BENDEKA) IVPB
90 mg/m2 | Freq: Once | INTRAVENOUS | 0 refills | Status: CP
Start: 2021-01-15 — End: ?
  Administered 2021-01-15 (×2): 200 mg via INTRAVENOUS

## 2021-01-15 MED ORDER — RITUXIMAB-PVVR IVPB (SPEC VOL)
375 mg/m2 | Freq: Once | INTRAVENOUS | 0 refills | Status: CP
Start: 2021-01-15 — End: ?
  Administered 2021-01-15 (×3): 800 mg via INTRAVENOUS

## 2021-01-15 NOTE — Patient Instructions
[facility]  Discharge Instructions      Alejandro Robinson  01/15/2021    Treatment Received Today:  [name]            Discharge Instructions  Call immediately to report the following:  ? Uncontrolled nausea or vomiting, pain, or bleeding  ? Temperature of 100.5 F or greater or any sign/symptom of infection (warmth, redness, tenderness)  ? Painful mouth or difficulty swallowing  ? Diarrhea   ? Swelling of arms or legs  ? Rash     Post-Treatment Directions:  ? Use mouth rinses after meals and at bedtime.  Use a non-alcohol commercial brand rinse   or mild salt water/baking soda rinse.    ? Drink 8-10 glasses of fluids daily.  ? Try to exercise daily to decrease fatigue.      Medication Instructions  If there are any specific medication instructions they are written below          Phone Numbers  [facility] Phone # [phone]    (Answered 24 hrs a day)      Return Appointments:      Hospital Outpatient Visit on 01/15/2021   Component Date Value Ref Range Status   ? White Blood Cells 01/15/2021 5.4  4.5 - 11.0 K/UL Final   ? RBC 01/15/2021 4.77  4.4 - 5.5 M/UL Final   ? Hemoglobin 01/15/2021 14.6  13.5 - 16.5 GM/DL Final   ? Hematocrit 09/81/1914 42.8  40 - 50 % Final   ? MCV 01/15/2021 89.8  80 - 100 FL Final   ? MCH 01/15/2021 30.5  26 - 34 PG Final   ? MCHC 01/15/2021 34.0  32.0 - 36.0 G/DL Final   ? RDW 78/29/5621 14.2  11 - 15 % Final   ? Platelet Count 01/15/2021 211  150 - 400 K/UL Final   ? MPV 01/15/2021 7.6  7 - 11 FL Final   ? Neutrophils 01/15/2021 63  41 - 77 % Final   ? Lymphocytes 01/15/2021 16 (A) 24 - 44 % Final   ? Monocytes 01/15/2021 16 (A) 4 - 12 % Final   ? Eosinophils 01/15/2021 4  0 - 5 % Final   ? Basophils 01/15/2021 1  0 - 2 % Final   ? Absolute Neutrophil Count 01/15/2021 3.40  1.8 - 7.0 K/UL Final   ? Absolute Lymph Count 01/15/2021 0.90 (A) 1.0 - 4.8 K/UL Final   ? Absolute Monocyte Count 01/15/2021 0.90 (A) 0 - 0.80 K/UL Final   ? Absolute Eosinophil Count 01/15/2021 0.20  0 - 0.45 K/UL Final   ? Absolute Basophil Count 01/15/2021 0.00  0 - 0.20 K/UL Final

## 2021-01-15 NOTE — Assessment & Plan Note
He does have a good amount of nausea Friday night, Saturday Sunday and Monday after his treatments.  He does use Zofran on an as-needed basis which does help some but still deals with the nausea over the weekend.  We will go ahead and start him on scheduled Zofran starting Friday night to take twice a day through Monday evening and also called in prescription for Compazine to take as needed.  We did discuss the importance of taking the Compazine as soon as the nausea starts.

## 2021-01-15 NOTE — Progress Notes
CHEMO NOTE  Verified chemo consent signed and in chart.    Blood return positive via: Peripheral (22 ga)    BSA and dose double checked (agree with orders as written) with: yes   R. Siegel RN    Labs/applicable tests checked: CBC and Comprehensive Metabolic Panel (CMP)    Chemo regimen: Drug/cycle/day    C5D1  rituximab-pvvr (RUXIENCE) 800 mg in sodium chloride 0.9% (NS) 250 mL IVPB (rapid infusion)   bendamustine (BENDEKA) 200 mg in sodium chloride 0.9% (NS) 58 mL IVPB     Rate verified and armband double check with second RN: yes    K. Violett RN, George Hugh RN    Patient education offered and stated understanding. Denies questions at this time.      Pt here for chemo. Pt was seen by Jeanine Luz APRN. Lab results reviewed and ok to treat. Premeds given. IV started in left forearm. Rituxan, Bendamustine given as ordered. Pt tolerated infusions well. IV removed and pt discharged to home in stable condition.

## 2021-01-16 ENCOUNTER — Encounter: Admit: 2021-01-16 | Discharge: 2021-01-16 | Payer: BC Managed Care – PPO | Primary: Primary Care

## 2021-01-16 DIAGNOSIS — C880 Waldenstrom macroglobulinemia: Secondary | ICD-10-CM

## 2021-01-16 MED ORDER — BENDAMUSTINE (BENDEKA) IVPB
90 mg/m2 | Freq: Once | INTRAVENOUS | 0 refills | Status: CP
Start: 2021-01-16 — End: ?
  Administered 2021-01-16 (×2): 200 mg via INTRAVENOUS

## 2021-01-16 MED ORDER — ONDANSETRON 8 MG PO TBDI
16 mg | Freq: Once | ORAL | 0 refills | Status: CP
Start: 2021-01-16 — End: ?
  Administered 2021-01-16: 19:00:00 16 mg via ORAL

## 2021-01-16 MED ORDER — DEXAMETHASONE 6 MG PO TAB
12 mg | Freq: Once | ORAL | 0 refills | Status: CP
Start: 2021-01-16 — End: ?
  Administered 2021-01-16: 19:00:00 12 mg via ORAL

## 2021-01-16 NOTE — Progress Notes
.  CHEMO NOTE  Verified chemo consent signed and in chart.    Blood return positive via: Peripheral (22 ga)    BSA and dose double checked (agree with orders as written) with: yes E Coffman RN    Labs/applicable tests checked: CBC    Chemo regimen: Drug/cycle/day Bendamustine D2 C5    Rate verified and armband double check with second RN: yes    Patient education offered and stated understanding. Denies questions at this time.

## 2021-02-11 NOTE — Progress Notes
Name: Alejandro Robinson          MRN: [id number]      DOB: Nov 07, 1965      AGE: 56 y.o.   DATE OF SERVICE: 02/12/2021    Subjective:             Reason for Visit:  Heme/Onc Care      Alejandro Robinson is a 55 y.o. male.     Cancer Staging  No matching staging information was found for the patient.    History of Present Illness  55 year old male patient of Dr. 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 He is accompanied in the clinic today by his wife.  He is here for labs, follow-up and consideration of cycle 6 of Rituxan and bendamustine.  ?  Overall, really doing very well.  He is tolerating treatment well.  He has trouble sleeping generally the night of treatment.  Has had some mild constipation.  Does have some fatigue that lasts for about 3 days after his treatment.  No fevers or signs of infection.  He is eating and drinking well.  ?  ?  Hematological history:  Waldenstrom's macroglobulinemia   Referred by [name], FNP for hematology oncology consultation due to diffuse abnormal lymphadenopathy.  ?  He had noted some mild adenopathy in bilateral neck regions for approximately 2 years and over the previous 8 months, he had noted an increase in adenopathy as well as some inguinal adenopathy left greater than right.  He denied any fevers sweats pruritus chills or weight loss but his wife [name] did  indicate he had experienced perhaps some sweats and some fatigue.  The nodes were not painful. In general he felt well. He denied any anorexia or weight loss.  ?  07/23/2020. ?CBC, CMP, and PSA were normal.  07/25/2020 ultrasound did show enlarged bilateral cervical adenopathy.  ?  08/05/2020 CT scan showed extensive bilateral cervical supraclavicular and mediastinal adenopathy with leading consideration being lymphoma.  ?  08/12/2020 PET scan performed at [facility] did  show diffuse adenopathy of the Ballentine tonsils with SUV of 8.25, extensive bilateral cervical chain adenopathy with lower SUVs of 2.9, 2.2, and 1.8, significant abnormal mediastinal and axillary adenopathy, abnormal retroperitoneal pelvic sidewall external iliac and inguinal adenopathy. ?There was no splenomegaly.  ?  09/24/20 cycle 1 rituximab, bendamustine.  10/23/20?cycle 2 rituxan, bendamustine  11/20/20: C3 R/B  12/18/20: C4 R/B  01/15/21: C5 R/B  02/12/21: C6 R/B    ??  No past medical history on file.  ?  Past surgical history that includes hand surgery (Left) and knee surgery (Left).  ?  Family history includes Cancer-Breast in his mother; Cancer-Prostate in his paternal grandfather.  ?  Social history: Alejandro Robinson reports that he has never smoked. His smokeless tobacco use includes snuff. He reports current alcohol use of about 5.0 standard drinks of alcohol per week. He reports previous drug use.  ?       Review of Systems   Constitutional: Negative.    HENT: Negative.    Eyes: Negative.    Respiratory: Negative.    Cardiovascular: Negative.    Gastrointestinal: Positive for constipation.   Genitourinary: Negative.    Musculoskeletal: Negative.    Skin: Negative.    Neurological: Negative.    Psychiatric/Behavioral: Negative.          Objective:         ? multivitamin (MULTIPLE VITAMIN PO) Take  by  mouth.   ? ondansetron HCL (ZOFRAN) 8 mg tablet Take 2 tablets by mouth daily on Days 3 and 4 of each cycle, then take 1 tablet every 8 hours as needed for nausea and vomiting.   ? other medication 1 Dose. Ageless Male 2 daily   ? other medication 1 Dose. Super Beta Prostate   ? prochlorperazine maleate (COMPAZINE) 10 mg tablet Take one tablet by mouth every 6 hours.     Vitals:    02/12/21 0903   BP: 127/76   BP Source: Arm, Right Upper   Pulse: 55   Temp: 36.2 ?C (97.2 ?F)   Resp: 16   SpO2: 100%   TempSrc: Temporal   PainSc: Zero   Weight: 93.8 kg (206 lb 12.8 oz)   Height: 186.5 cm (6' 1.43)     Body mass index is 26.97 kg/m?[name]     Pain Score: Zero       Fatigue Scale: 0-None    Pain Addressed:  N/A    Patient Evaluated for a Clinical Trial: No treatment clinical trial available for this patient.     [place] Cooperative Oncology Group performance status is 0, Fully active, able to carry on all pre-disease performance without restriction.[name]     Physical Exam  Constitutional:       General: He is not in acute distress.     Appearance: He is well-developed.   HENT:      Head: Normocephalic and atraumatic.      Nose: Nose normal.      Mouth/Throat:      Mouth: Mucous membranes are moist.   Eyes:      Extraocular Movements: Extraocular movements intact.      Pupils: Pupils are equal, round, and reactive to light.   Cardiovascular:      Rate and Rhythm: Normal rate.      Heart sounds: Normal heart sounds.   Pulmonary:      Effort: Pulmonary effort is normal.      Breath sounds: Normal breath sounds.   Abdominal:      General: Bowel sounds are normal.      Palpations: Abdomen is soft.   Musculoskeletal:         General: Normal range of motion.      Cervical back: Normal range of motion and neck supple.      Right lower leg: No edema.      Left lower leg: No edema.   Skin:     General: Skin is warm and dry.   Neurological:      General: No focal deficit present.      Mental Status: He is alert and oriented to person, place, and time.   Psychiatric:         Behavior: Behavior normal.         Thought Content: Thought content normal.         Judgment: Judgment normal.     CMP pending  CBC w diff    Lab Results   Component Value Date/Time    WBC 5.3 02/12/2021 08:32 AM    RBC 4.66 02/12/2021 08:32 AM    HGB 14.3 02/12/2021 08:32 AM    HCT 41.8 02/12/2021 08:32 AM    MCV 89.7 02/12/2021 08:32 AM    MCH 30.6 02/12/2021 08:32 AM    MCHC 34.1 02/12/2021 08:32 AM    RDW 14.1 02/12/2021 08:32 AM    PLTCT 193 02/12/2021 08:32 AM    MPV 7.3  02/12/2021 08:32 AM    Lab Results   Component Value Date/Time    NEUT 59 02/12/2021 08:32 AM    ANC 3.10 02/12/2021 08:32 AM    LYMA 12 (L) 02/12/2021 08:32 AM    ALC 0.60 (L) 02/12/2021 08:32 AM    MONA 21 (H) 02/12/2021 08:32 AM    AMC 1.10 (H) 02/12/2021 08:32 AM    EOSA 7 (H) 02/12/2021 08:32 AM    AEC 0.40 02/12/2021 08:32 AM    BASA 1 02/12/2021 08:32 AM    ABC 0.10 02/12/2021 08:32 AM                  Assessment and Plan:  Waldenstrom macroglobulinemia (HCC)  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 ?He continues on treatment with Rituxan and bendamustine and is due today for cycle 6/6. ?Overall continues to tolerate treatment very well. ?He has had an excellent response with almost complete resolution of bulky adenopathy.?CBC as above, CMP is pending. ?We will go ahead and proceed with treatment today. ?Overall plan is for him to complete this last cycle for a total of 6 cycles.  After this cycle,  he will have a repeat bone marrow biopsy and PET scan. They are planning a vacation 7/17/7/22/22. Will arrange after that time and follow up with Dr. Willette Pa   ?  Drug induced insomnia (HCC)  Secondary to steroids.  He can use over-the-counter Benadryl as needed.  ?  Drug induced constipation  Secondary to treatment.  He uses MiraLAX or stool softeners as needed with good effect.  ?  Fatigue due to exposure  He does note a few days of fatigue after each treatment but then his energy returns.  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  ?  CINV (chemotherapy-induced nausea and vomiting)  He does have nausea Friday night, Saturday Sunday and Monday after his treatments.  He has been on scheduled Zofran starting Friday night to take twice a day through Monday evening and is also using  Compazine to take as needed-which has been helpful. Weight is stable. .    Patient verbalized understanding and agreement with the plan. Time spent including  face to face encounter, chart and diagnostic review, orders, documentation, and counseling and coordination of care was 35 minutes  ?  ?

## 2021-02-12 ENCOUNTER — Encounter: Admit: 2021-02-12 | Discharge: 2021-02-12 | Payer: BC Managed Care – PPO | Primary: Primary Care

## 2021-02-12 DIAGNOSIS — C880 Waldenstrom macroglobulinemia: Secondary | ICD-10-CM

## 2021-02-12 DIAGNOSIS — T732XXD Exhaustion due to exposure, subsequent encounter: Secondary | ICD-10-CM

## 2021-02-12 DIAGNOSIS — F19982 Other psychoactive substance use, unspecified with psychoactive substance-induced sleep disorder: Secondary | ICD-10-CM

## 2021-02-12 DIAGNOSIS — K5903 Drug induced constipation: Secondary | ICD-10-CM

## 2021-02-12 DIAGNOSIS — R112 Nausea with vomiting, unspecified: Secondary | ICD-10-CM

## 2021-02-12 MED ORDER — ONDANSETRON 8 MG PO TBDI
16 mg | Freq: Once | ORAL | 0 refills | Status: CP
Start: 2021-02-12 — End: ?
  Administered 2021-02-12: 15:00:00 16 mg via ORAL

## 2021-02-12 MED ORDER — RITUXIMAB-PVVR IVPB (SPEC VOL)
375 mg/m2 | Freq: Once | INTRAVENOUS | 0 refills | Status: CP
Start: 2021-02-12 — End: ?
  Administered 2021-02-12 (×3): 800 mg via INTRAVENOUS

## 2021-02-12 MED ORDER — DEXAMETHASONE 6 MG PO TAB
12 mg | Freq: Once | ORAL | 0 refills | Status: CP
Start: 2021-02-12 — End: ?
  Administered 2021-02-12: 15:00:00 12 mg via ORAL

## 2021-02-12 MED ORDER — DIPHENHYDRAMINE HCL 25 MG PO CAP
25 mg | Freq: Once | ORAL | 0 refills | Status: CP
Start: 2021-02-12 — End: ?
  Administered 2021-02-12: 15:00:00 25 mg via ORAL

## 2021-02-12 MED ORDER — BENDAMUSTINE (BENDEKA) IVPB
90 mg/m2 | Freq: Once | INTRAVENOUS | 0 refills | Status: CP
Start: 2021-02-12 — End: ?
  Administered 2021-02-12 (×2): 200 mg via INTRAVENOUS

## 2021-02-12 MED ORDER — ACETAMINOPHEN 325 MG PO TAB
650 mg | Freq: Once | ORAL | 0 refills | Status: CP
Start: 2021-02-12 — End: ?
  Administered 2021-02-12: 15:00:00 650 mg via ORAL

## 2021-02-12 NOTE — Patient Instructions
[facility]  Discharge Instructions      FUAD FORGET  02/12/2021    Treatment Received Today:  Sharia Reeve            Discharge Instructions  Call immediately to report the following:   Uncontrolled nausea or vomiting, pain, or bleeding   Temperature of 100.5 F or greater or any sign/symptom of infection (warmth, redness, tenderness)   Painful mouth or difficulty swallowing   Diarrhea    Swelling of arms or legs   Rash     Post-Treatment Directions:   Use mouth rinses after meals and at bedtime.  Use a non-alcohol commercial brand rinse   or mild salt water/baking soda rinse.     Drink 8-10 glasses of fluids daily.   Try to exercise daily to decrease fatigue.      Medication Instructions  If there are any specific medication instructions they are written below          Phone Numbers  [place] Phone # [phone]    (Answered 24 hrs a day)    For up to date information on the COVID-19 virus, visit the [facility] website. [email or web address]-[email or web address]   General supportive care during cold and flu season and infection prevention reminders:    o Wash hands often with soap and water for at least 20 seconds   o Cover your mouth and nose   o Social distancing: try to maintain 6 feet between you and other people   o Stay home if sick and symptoms mild or manageable?   If you must be around people wear a mask     If you are having symptoms of a lower respiratory infection (cough, shortness of breath) and/or fever AND either traveled in last 30 days (internationally or to region of exposure) OR known exposure to patient with COVID19:     o Call your primary care provider for questions or health needs.    Tell your doctor about your recent travel and your symptoms     o In a medical emergency, call 911 or go to the nearest emergency room.

## 2021-02-13 ENCOUNTER — Encounter: Admit: 2021-02-13 | Discharge: 2021-02-13 | Payer: BC Managed Care – PPO | Primary: Primary Care

## 2021-02-13 DIAGNOSIS — C880 Waldenstrom macroglobulinemia: Secondary | ICD-10-CM

## 2021-02-13 MED ORDER — ONDANSETRON 8 MG PO TBDI
16 mg | Freq: Once | ORAL | 0 refills | Status: CP
Start: 2021-02-13 — End: ?
  Administered 2021-02-13: 19:00:00 16 mg via ORAL

## 2021-02-13 MED ORDER — DEXAMETHASONE 6 MG PO TAB
12 mg | Freq: Once | ORAL | 0 refills | Status: CP
Start: 2021-02-13 — End: ?
  Administered 2021-02-13: 19:00:00 12 mg via ORAL

## 2021-02-13 MED ORDER — BENDAMUSTINE (BENDEKA) IVPB
90 mg/m2 | Freq: Once | INTRAVENOUS | 0 refills | Status: CP
Start: 2021-02-13 — End: ?
  Administered 2021-02-13 (×2): 200 mg via INTRAVENOUS

## 2021-02-13 NOTE — Progress Notes
CHEMO NOTE  Verified chemo consent signed and in chart.    Blood return positive via: Peripheral (22 ga)    BSA and dose double checked (agree with orders as written) with: yes with Jeanine Luz., RN chemo certified nurse    Labs/applicable tests checked: CBC and Comprehensive Metabolic Panel (CMP)    Chemo regimen: C6D2  bendamustine (BENDEKA) 200 mg in sodium chloride 0.9% (NS) 58 mL IVPBOrdered Dose: 90 mg/m2  2.16 m2 (Treatment Plan Recorded) : Admin Dose: 200 mg : 348 mL/hr : Intravenous : ONCE     Rate verified and armband double check with second RN: yes with Jeanine Luz., RN     Patient education offered and stated understanding. Denies questions at this time.    Patient presents for Bendeka infusion. 22G peripheral IV inserted to left hand x1. Pre-medications administered. Infusion tolerated well. IV removed. Patient left ambulatory and at baseline condition.

## 2021-02-13 NOTE — Patient Instructions
.  [facility]  Chemotherapy Instructions    Alejandro Robinson 02/13/2021    Chemotherapy Drugs:  Bendeka    Call Immediately to report the following:  Unexplained bleeding or bleeding that will not stop  Difficulty swallowing  Shortness of breath, wheezing, or trouble breathing  Rapid, irregular heartbeat; chest pain  Dizziness, lightheadedness  Rash or cut that swells or turns red, feels hot or painful, or begin to ooze  Diarrhea   Uncontrolled nausea or vomiting  Fever of 100.4 F or higher, or chills    Important Phone Numbers:  [place] Number (answered 24 hours a day) [place] (appointments) [phone]  Social Worker [phone]    .For up-to-date information on COVID-19, visit the [facility] website. [email or web address]   General supportive care during cold and flu season and infection prevention reminders:   o Wash hands often with soap and water for at least 20 seconds or use hand sanitizer with at least 60% alcohol.   o Cover your mouth and nose.  o Practice social distancing: Try to maintain 6 feet between you and people outside your household.  o Avoid crowds and poorly ventilated spaces.  o Stay home if sick and symptoms are mild or manageable. If you must be around other people, wear a mask and practice social distancing.      If you are having symptoms of a lower respiratory infection (cough, shortness of breath) and/or fever or have been exposed to someone with COVID-19:    o Call your primary care provider for questions or health needs.   - Tell your doctor about your recent travel and your symptoms          In a medical emergency, call 911 or go to the nearest emergency room.

## 2021-02-25 ENCOUNTER — Encounter: Admit: 2021-02-25 | Discharge: 2021-02-25 | Payer: BC Managed Care – PPO | Primary: Primary Care

## 2021-03-13 ENCOUNTER — Encounter: Admit: 2021-03-13 | Discharge: 2021-03-13 | Payer: BC Managed Care – PPO | Primary: Primary Care

## 2021-03-14 ENCOUNTER — Encounter: Admit: 2021-03-14 | Discharge: 2021-03-14 | Payer: BC Managed Care – PPO | Primary: Primary Care

## 2021-03-14 DIAGNOSIS — C880 Waldenstrom macroglobulinemia: Secondary | ICD-10-CM

## 2021-04-08 ENCOUNTER — Encounter: Admit: 2021-04-08 | Discharge: 2021-04-08 | Payer: BC Managed Care – PPO | Primary: Primary Care

## 2021-04-08 DIAGNOSIS — C880 Waldenstrom macroglobulinemia: Secondary | ICD-10-CM

## 2021-04-08 NOTE — Progress Notes
Name: AAYDEN CEFALU          MRN: [id number]      DOB: Aug 11, 1965      AGE: 55 y.o.   DATE OF SERVICE: 04/08/2021    Subjective:             Reason for Visit:  Heme/Onc Care      REUBEN KNOBLOCK is a 55 y.o. male.       History of Present Illness  Norah is seen today along with his wife [name] in follow-up of his Waldenstr?m's macroglobulinemia. He is here today to review restaging studies.    03/10/2021 follow-up PET scan showed resolution of the previously seen extensive adenopathy.  No concerning lesions.  03/21/2021 bone marrow biopsy shows no evidence of Walden Strom's.  Flow cytometry and cytogenetics are negative.  ?  Overall, he is doing very well.  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  He does still note some mild fatigue and some mild dysgeusia.  ?  ?  Hematological history:  Waldenstrom's macroglobulinemia   Referred by [name], FNP for hematology oncology consultation due to diffuse abnormal lymphadenopathy.  ?  He had noted some mild adenopathy in bilateral neck regions for approximately 2 years and over the previous 8 months, he had noted an increase in adenopathy as well as some inguinal adenopathy left greater than right.  He denied any fevers sweats pruritus chills or weight loss but his wife [name] did  indicate he had experienced perhaps some sweats and some fatigue.  The nodes were not painful. In general he felt well. He denied any anorexia or weight loss.  ?  07/23/2020. ?CBC, CMP, and PSA were normal.  07/25/2020 ultrasound did show enlarged bilateral cervical adenopathy.  ?  08/05/2020 CT scan showed extensive bilateral cervical supraclavicular and mediastinal adenopathy with leading consideration being lymphoma.  ?  08/12/2020 PET scan performed at [facility] did  show diffuse adenopathy of the Ballentine tonsils with SUV of 8.25, extensive bilateral cervical chain adenopathy with lower SUVs of 2.9, 2.2, and 1.8, significant abnormal mediastinal and axillary adenopathy, abnormal retroperitoneal pelvic sidewall external iliac and inguinal adenopathy. ?There was no splenomegaly.  ?  09/24/20 cycle 1 rituximab, bendamustine.  10/23/20?cycle 2 rituxan, bendamustine  11/20/20: C3 R/B  12/18/20: C4 R/B  01/15/21: C5 R/B  02/12/21: C6 R/B  ?  ??  No past medical history on file.  ?  Past surgical history that includes hand surgery (Left) and knee surgery (Left).  ?  Family history includes Cancer-Breast in his mother; Cancer-Prostate in his paternal grandfather.  ?  Social history: Allan?reports that he has never smoked. His smokeless tobacco use includes snuff. He reports current alcohol use of about 5.0 standard drinks of alcohol per week. He reports previous drug use.  ?  ?       Review of Systems  Still with slight fatigue.  Mild dysgeusia.  Still with slight sweats at times.  Review of Systems   Constitutional: Negative for fever and chills.   HENT: Negative for ear pain, sore throat, mouth sores, trouble swallowing, neck pain and sinus pressure.    Eyes: Negative for visual disturbance.   Respiratory: Negative for shortness of breath.    Cardiovascular: Negative for chest pain.   Gastrointestinal: Negative for nausea, diarrhea, constipation and blood in stool.   Genitourinary: Negative for dysuria, urgency, frequency, difficulty urinating and dyspareunia.   Musculoskeletal: Negative.    Neurological: Negative  for weakness, numbness and headaches.   Hematological: Does not bruise/bleed easily.   Psychiatric/Behavioral: Negative for confusion and decreased concentration. The patient is not nervous/anxious.        Objective:         ? multivitamin (MULTIPLE VITAMIN PO) Take  by mouth.   ? ondansetron HCL (ZOFRAN) 8 mg tablet Take 2 tablets by mouth daily on Days 3 and 4 of each cycle, then take 1 tablet every 8 hours as needed for nausea and vomiting.   ? other medication 1 Dose. Ageless Male 2 daily   ? other medication 1 Dose. Super Beta Prostate   ? prochlorperazine maleate (COMPAZINE) 10 mg tablet Take one tablet by mouth every 6 hours.     Vitals:    04/08/21 1032   BP: 134/80   BP Source: Arm, Right Upper   Pulse: 56   Temp: 36.2 ?C (97.2 ?F)   Resp: 20   SpO2: 100%   TempSrc: Temporal   PainSc: Zero   Weight: 94.3 kg (207 lb 12.8 oz)   Height: 186.7 cm (6' 1.5)     Body mass index is 27.04 kg/m?[name]     Pain Score: Zero       Fatigue Scale: 0-None    Pain Addressed:  N/A    Patient Evaluated for a Clinical Trial: Patient not eligible for a treatment trial (including not needing treatment, needs palliative care, in remission).     [place] Cooperative Oncology Group performance status is 0, Fully active, able to carry on all pre-disease performance without restriction.[name]     Physical Exam     A pleasant well-appearing Caucasian male in no distress.  HEENT: Ruddy facial complexion.  Neck: No adenopathy.  Axilla: No adenopathy.  Neuro: Alert and oriented x3.     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 ?He recently completed treatment with Rituxan and bendamustine and he went restaging studies with PET scan and bone marrow aspiration and biopsy which all show remission.  These are very exciting results.  We will plan follow-up lab work and clinical examination and 3 months and then follow-up lab work PET scan clinical examination in 6 months.  ?  ?  Fatigue due to exposure  He does note some mild fatigue which hopefully will improve as we are further out from his chemotherapy completion. 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  ?    Patient verbalized understanding and agreement with the plan. Time spent including  face to face encounter, chart and diagnostic review, orders, documentation, and counseling and coordination of care was 35 minutes

## 2021-07-09 ENCOUNTER — Encounter: Admit: 2021-07-09 | Discharge: 2021-07-09 | Payer: BC Managed Care – PPO | Primary: Primary Care

## 2021-07-09 DIAGNOSIS — T732XXD Exhaustion due to exposure, subsequent encounter: Secondary | ICD-10-CM

## 2021-07-09 DIAGNOSIS — C880 Waldenstrom macroglobulinemia: Secondary | ICD-10-CM

## 2021-07-09 DIAGNOSIS — L989 Disorder of the skin and subcutaneous tissue, unspecified: Secondary | ICD-10-CM

## 2021-07-09 LAB — CBC AND DIFF
ABSOLUTE BASO COUNT: 0 K/UL (ref 0–0.20)
ABSOLUTE EOS COUNT: 0.2 K/UL (ref 0–0.45)
ABSOLUTE LYMPH COUNT: 0.8 K/UL — ABNORMAL LOW (ref 1.0–4.8)
ABSOLUTE MONO COUNT: 0.7 K/UL (ref 0–0.80)
ABSOLUTE NEUTROPHIL: 3.1 K/UL (ref 1.8–7.0)
BASOPHILS %: 1 % (ref 0–2)
EOSINOPHILS %: 4 % (ref >60)
HEMATOCRIT: 44 % (ref 40–50)
HEMOGLOBIN: 15 g/dL (ref 13.5–16.5)
LYMPHOCYTES %: 17 % — ABNORMAL LOW (ref 24–44)
MCH: 30 pg (ref 26–34)
MCHC: 34 g/dL (ref 32.0–36.0)
MCV: 89 FL (ref 80–100)
MONOCYTES %: 14 % — ABNORMAL HIGH (ref 4–12)
MPV: 7.2 FL (ref 7–11)
NEUTROPHILS %: 64 % — ABNORMAL HIGH (ref 41–77)
PLATELET COUNT: 215 K/UL (ref 150–400)
RBC COUNT: 5 M/UL — ABNORMAL HIGH (ref 4.4–5.5)
RDW: 13 % (ref 11–15)
WBC COUNT: 4.7 K/UL (ref 4.5–11.0)

## 2021-07-09 LAB — IMMUNOGLOBULINS-IGA,IGG,IGM
IGA: 99 mg/dL (ref 70–390)
IGG: 668 mg/dL — ABNORMAL LOW (ref 762–1488)

## 2021-07-09 LAB — COMPREHENSIVE METABOLIC PANEL
POTASSIUM: 4.6 MMOL/L (ref 3.5–5.1)
SODIUM: 141 MMOL/L (ref 137–147)

## 2021-07-09 LAB — LDH-LACTATE DEHYDROGENASE: LDH: 224 U/L — ABNORMAL HIGH (ref 100–210)

## 2021-07-09 NOTE — Progress Notes
Name: BEE HAMMERSCHMIDT          MRN: [id number]      DOB: 1965-12-29      AGE: 55 y.o.   DATE OF SERVICE: 07/09/2021    Subjective:             Reason for Visit:  Heme/Onc Care      FINDLAY DAGHER is a 55 y.o. male.     Cancer Staging  No matching staging information was found for the patient.    History of Present Illness  55 year old male patient of Dr. Willette Pa who comes in for 3 month follow up. 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 He last saw Dr. Willette Pa on 04/08/2021.   03/10/2021 follow-up PET scan showed resolution of the previously seen extensive adenopathy.  No concerning lesions.  03/21/2021 bone marrow biopsy shows no evidence of Walden Strom's.  Flow cytometry and cytogenetics are negative.  ?  Overall, he is doing very well. He is accompanied by his wife. Their son is getting married next weekend.   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 He does still note some mild fatigue and some mild dysgeusia.  ?  ?  Hematological history:  Waldenstrom's macroglobulinemia   Referred by [name], FNP for hematology oncology consultation due to diffuse abnormal lymphadenopathy.  ?  He had?noted some mild adenopathy in bilateral neck regions for approximately 2 years and over the previous?8 months, he?had?noted an increase in adenopathy as well as some inguinal adenopathy left greater than right.  He denied?any fevers sweats pruritus chills or weight loss but his wife [name] did??indicate he had?experienced perhaps some sweats and some fatigue. The nodes?were not painful. In general he felt?well. He denied?any anorexia or weight loss.  ?  07/23/2020. ?CBC, CMP, and PSA were normal.  07/25/2020 ultrasound did show enlarged bilateral cervical adenopathy.  ?  08/05/2020 CT scan showed extensive bilateral cervical supraclavicular and mediastinal adenopathy with leading consideration being lymphoma.  ?  08/12/2020 PET scan performed at [facility] did??show diffuse adenopathy of the Ballentine tonsils with SUV of 8.25, extensive bilateral cervical chain adenopathy with lower SUVs of 2.9, 2.2, and 1.8, significant abnormal mediastinal and axillary adenopathy, abnormal retroperitoneal pelvic sidewall external iliac and inguinal adenopathy. ?There?was no splenomegaly.  ?  09/24/20 cycle 1 rituximab, bendamustine.  10/23/20?cycle 2 rituxan, bendamustine  11/20/20: C3 R/B  12/18/20: C4 R/B  01/15/21: C5 R/B  02/12/21: C6 R/B  ?  ??  Past medical history: Waldenstroms. .  ?  Past surgical history that includes hand surgery (Left) and knee surgery (Left).  ?  Family history includes Cancer-Breast in his mother; Cancer-Prostate in his paternal grandfather.  ?  Social history:?He reports that he has never smoked. His smokeless tobacco use includes snuff. He reports current alcohol use of about 5.0 standard drinks of alcohol per week. He reports previous drug use.  ?       Review of Systems   Constitutional: Negative.    HENT: Negative.    Eyes: Negative.    Respiratory: Negative.    Cardiovascular: Negative.    Gastrointestinal: Negative.    Genitourinary: Negative.    Musculoskeletal: Negative.    Skin: Negative.         Dry, scaling lesions on both sides of the face.    Neurological: Negative.    Psychiatric/Behavioral: Negative.          Objective:         ?  multivitamin (MULTIPLE VITAMIN PO) Take  by mouth.   ? ondansetron HCL (ZOFRAN) 8 mg tablet Take 2 tablets by mouth daily on Days 3 and 4 of each cycle, then take 1 tablet every 8 hours as needed for nausea and vomiting.   ? other medication 1 Dose. Ageless Male 2 daily   ? other medication 1 Dose. Super Beta Prostate   ? prochlorperazine maleate (COMPAZINE) 10 mg tablet Take one tablet by mouth every 6 hours.     Vitals:    07/09/21 0834   BP: 133/80   BP Source: Arm, Left Upper   Pulse: 66   Temp: 36.3 ?C (97.4 ?F)   Resp: 18   SpO2: 100%   TempSrc: Temporal   PainSc: Zero   Weight: 93.8 kg (206 lb 12.8 oz)     Body mass index is 26.91 kg/m?[name]     Pain Score: Zero       Fatigue Scale: 0-None    Pain Addressed:  N/A    Patient Evaluated for a Clinical Trial: Patient not eligible for a treatment trial (including not needing treatment, needs palliative care, in remission).     [place] Cooperative Oncology Group performance status is 0, Fully active, able to carry on all pre-disease performance without restriction.[name]     Physical Exam  Constitutional:       General: He is not in acute distress.     Appearance: He is well-developed.   HENT:      Head: Normocephalic and atraumatic.      Nose: Nose normal.      Mouth/Throat:      Mouth: Mucous membranes are moist.   Eyes:      Extraocular Movements: Extraocular movements intact.      Pupils: Pupils are equal, round, and reactive to light.   Cardiovascular:      Rate and Rhythm: Normal rate.      Heart sounds: Normal heart sounds.   Pulmonary:      Effort: Pulmonary effort is normal.      Breath sounds: Normal breath sounds.   Abdominal:      General: Bowel sounds are normal.      Palpations: Abdomen is soft.   Musculoskeletal:         General: Normal range of motion.      Cervical back: Normal range of motion and neck supple.      Right lower leg: No edema.      Left lower leg: No edema.   Lymphadenopathy:      Cervical: No cervical adenopathy.   Skin:     General: Skin is warm and dry.      Comments: 1 cm scaling lesion on right temple. 2 lesions on left temple, 1.5 cm and 1 cm scaling lesions.. Patchy, mild erythema, no drainage, no bleeding.    Neurological:      General: No focal deficit present.      Mental Status: He is alert and oriented to person, place, and time.   Psychiatric:         Behavior: Behavior normal.         Thought Content: Thought content normal.         Judgment: Judgment normal.     CMP, LDH, immunoglobulins pending  CBC w diff    Lab Results   Component Value Date/Time    WBC 4.7 07/09/2021 08:22 AM    RBC 5.00 07/09/2021 08:22 AM    HGB 15.2 07/09/2021  08:22 AM    HCT 44.7 07/09/2021 08:22 AM    MCV 89.4 07/09/2021 08:22 AM    MCH 30.4 07/09/2021 08:22 AM    MCHC 34.1 07/09/2021 08:22 AM    RDW 13.5 07/09/2021 08:22 AM    PLTCT 215 07/09/2021 08:22 AM    MPV 7.2 07/09/2021 08:22 AM    Lab Results   Component Value Date/Time    NEUT 64 07/09/2021 08:22 AM    ANC 3.10 07/09/2021 08:22 AM    LYMA 17 (L) 07/09/2021 08:22 AM    ALC 0.80 (L) 07/09/2021 08:22 AM    MONA 14 (H) 07/09/2021 08:22 AM    AMC 0.70 07/09/2021 08:22 AM    EOSA 4 07/09/2021 08:22 AM    AEC 0.20 07/09/2021 08:22 AM    BASA 1 07/09/2021 08:22 AM    ABC 0.00 07/09/2021 08:22 AM                  Assessment and Plan:  Waldenstrom macroglobulinemia (HCC)  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 ?He recently completed treatment with Rituxan and bendamustine and he went through restaging studies with PET scan and bone marrow aspiration and biopsy which all show remission. We will plan follow-up lab work and PET scan,  clinical examination in 3 months.  ??  Fatigue due to exposure  He does note some mild fatigue which has improved as we move further out from his chemotherapy completion. 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     Skin lesions  Bilateral temples. Could be acinic keratoses or eczema/psoriasis. Recommend he see dermatology after his son's wedding.   ?  ?  Patient verbalized understanding and agreement with the plan.?Time spent including ?face to face encounter, chart and diagnostic review, orders, documentation, and counseling and coordination of care was 35?minutes

## 2021-08-01 ENCOUNTER — Encounter: Admit: 2021-08-01 | Discharge: 2021-08-01 | Payer: BC Managed Care – PPO | Primary: Primary Care

## 2021-08-01 NOTE — Telephone Encounter
Received call from [place] with [facility] NP Charlton Amor' office stating patient is seen in their clinic. Has has 2 week history of illness and not feeling well but is COVID and Flu negative on testing. States they did put him on an antibiotic and drew labs 12/20 and 12/23. Requesting results so Dr. Evangeline Gula may review and update them. Appreciative. [phone]

## 2021-10-03 ENCOUNTER — Encounter: Admit: 2021-10-03 | Discharge: 2021-10-03 | Payer: BC Managed Care – PPO | Primary: Primary Care

## 2021-10-06 ENCOUNTER — Encounter: Admit: 2021-10-06 | Discharge: 2021-10-06 | Payer: BC Managed Care – PPO | Primary: Primary Care

## 2021-10-06 DIAGNOSIS — C880 Waldenstrom macroglobulinemia: Secondary | ICD-10-CM

## 2021-10-07 ENCOUNTER — Encounter: Admit: 2021-10-07 | Discharge: 2021-10-07 | Payer: BC Managed Care – PPO | Primary: Primary Care

## 2021-10-07 DIAGNOSIS — C880 Waldenstrom macroglobulinemia: Secondary | ICD-10-CM

## 2021-10-07 DIAGNOSIS — T732XXD Exhaustion due to exposure, subsequent encounter: Secondary | ICD-10-CM

## 2021-10-07 LAB — CBC AND DIFF
HEMATOCRIT: 44 % (ref 40–50)
HEMOGLOBIN: 15 g/dL (ref 13.5–16.5)
LYMPHOCYTES %: 13 % — ABNORMAL LOW (ref 24–44)
MCH: 29 pg (ref 26–34)
MCHC: 33 g/dL (ref 32.0–36.0)
MCV: 88 FL (ref 80–100)
MPV: 7.5 FL (ref 7–11)
NEUTROPHILS %: 71 % (ref 41–77)
PLATELET COUNT: 221 K/UL (ref 150–400)
RBC COUNT: 5 M/UL — ABNORMAL HIGH (ref >60)
RDW: 14 % (ref 11–15)
WBC COUNT: 5.7 K/UL — ABNORMAL LOW (ref 4.5–11.0)

## 2021-10-07 LAB — COMPREHENSIVE METABOLIC PANEL
POTASSIUM: 4.7 MMOL/L (ref 3.5–5.1)
SODIUM: 141 MMOL/L (ref 137–147)

## 2021-10-07 LAB — LDH-LACTATE DEHYDROGENASE: LDH: 209 U/L (ref 100–210)

## 2021-10-07 LAB — IMMUNOGLOBULINS-IGA,IGG,IGM
IGA: 101 mg/dL (ref 70–390)
IGG: 683 mg/dL — ABNORMAL LOW (ref 762–1488)

## 2021-10-07 NOTE — Progress Notes
Name: Alejandro Robinson          MRN: [id number]      DOB: 03/17/1966      AGE: 56 y.o.   DATE OF SERVICE: 10/07/2021    Subjective:             Reason for Visit:  Heme/Onc Care      Alejandro Robinson is a 56 y.o. male.       History of Present Illness  Alejandro Robinson is seen today for 3 month hematology follow up. 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 Alejandro Robinson remains in remission.    Alejandro Robinson had a follow-up PET scan at [facility] in [place] 09/11/2021 with no activity.  This looks excellent.  CBC today is normal and CMP, LDH, and immunoglobulins are pending.    Alejandro Robinson is feeling well.  Very good energy.  Denies any recent infectious symptomatology.    Alejandro Robinson did have a viral infection or early pneumonia and December 2022 and did have some leukopenia at that time and this all resolved completely.      03/10/2021 follow-up PET scan showed resolution of the previously seen extensive adenopathy. ?No concerning lesions.  03/21/2021 bone marrow biopsy shows no evidence of Walden Strom's. ?Flow cytometry and cytogenetics are negative.  ?  Overall,?Alejandro Robinson is doing very well. Alejandro Robinson is accompanied by his daughter Alejandro Robinson who is an LPN at the hospital in [place].  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  ?  ?  Hematological history:  Waldenstrom's macroglobulinemia   Referred by Alejandro Piedra, FNP for hematology oncology consultation due to diffuse abnormal lymphadenopathy.  ?  Alejandro Robinson had?noted some mild adenopathy in bilateral neck regions for approximately 2 years and over the previous?8 months, Alejandro Robinson?had?noted an increase in adenopathy as well as some inguinal adenopathy left greater than right.  Alejandro Robinson denied?any fevers sweats pruritus chills or weight loss but his wife Alejandro Robinson did??indicate Alejandro Robinson had?experienced perhaps some sweats and some fatigue. The nodes?were not painful. In general Alejandro Robinson felt?well. Alejandro Robinson denied?any anorexia or weight loss.  ?  07/23/2020. ?CBC, CMP, and PSA were normal.  07/25/2020 ultrasound did show enlarged bilateral cervical adenopathy.  ?  08/05/2020 CT scan showed extensive bilateral cervical supraclavicular and mediastinal adenopathy with leading consideration being lymphoma.  ?  08/12/2020 PET scan performed at [facility] did??show diffuse adenopathy of the Ballentine tonsils with SUV of 8.25, extensive bilateral cervical chain adenopathy with lower SUVs of 2.9, 2.2, and 1.8, significant abnormal mediastinal and axillary adenopathy, abnormal retroperitoneal pelvic sidewall external iliac and inguinal adenopathy. ?There?was no splenomegaly.  ?  09/24/20 cycle 1 rituximab, bendamustine.  10/23/20?cycle 2 rituxan, bendamustine  11/20/20: C3 R/B  12/18/20: C4 R/B  01/15/21: C5 R/B  02/12/21: C6 R/B  03/2021: Negative PET scan  09/2021: Negative PET scan  ?  ??  Past medical history: Waldenstroms. .  ?  Past surgical history that includes hand surgery (Left) and knee surgery (Left).  ?  Family history includes Cancer-Breast in his mother; Cancer-Prostate in his paternal grandfather.  ?  Social history:?Alejandro Robinson reports that Alejandro Robinson has never smoked. His smokeless tobacco use includes snuff. Alejandro Robinson reports current alcohol use of about 5.0 standard drinks of alcohol per week. Alejandro Robinson reports previous drug use.  ?         Review of Systems  Review of Systems   Constitutional: Negative for fever and chills.  HENT: Negative for ear pain, sore throat, mouth sores, trouble swallowing, neck pain and sinus pressure.    Eyes: Negative for visual disturbance.   Respiratory: Negative for shortness of breath.    Cardiovascular: Negative for chest pain.   Gastrointestinal: Negative for nausea, diarrhea, constipation and blood in stool.   Genitourinary: Negative for dysuria, urgency, frequency, difficulty urinating and dyspareunia.   Musculoskeletal: Negative.    Neurological: Negative for weakness, numbness and headaches.   Hematological: Does not bruise/bleed easily. Psychiatric/Behavioral: Negative for confusion and decreased concentration. The patient is not nervous/anxious.        Objective:         ? multivitamin (MULTIPLE VITAMIN PO) Take  by mouth.   ? ondansetron HCL (ZOFRAN) 8 mg tablet Take 2 tablets by mouth daily on Days 3 and 4 of each cycle, then take 1 tablet every 8 hours as needed for nausea and vomiting.   ? other medication one Dose. Ageless Male 2 daily   ? other medication one Dose. Super Beta Prostate   ? prochlorperazine maleate (COMPAZINE) 10 mg tablet Take one tablet by mouth every 6 hours.     Vitals:    10/07/21 0834   BP: 120/89   BP Source: Leg, Left Upper   Pulse: 64   Temp: 36.7 ?C (98 ?F)   Resp: 18   SpO2: 100%   TempSrc: Temporal   PainSc: Zero   Weight: 95 kg (209 lb 6.4 oz)     Body mass index is 27.25 kg/m?[name]     Pain Score: Zero       Fatigue Scale: 0-None    Pain Addressed:  N/A    Patient Evaluated for a Clinical Trial: Patient not eligible for a treatment trial (including not needing treatment, needs palliative care, in remission).     [place] Cooperative Oncology Group performance status is 0, Fully active, able to carry on all pre-disease performance without restriction.[name]     Physical Exam     Well-appearing Caucasian male in no distress.  HEENT: No scleral icterus.  Alejandro Robinson does have some chronic skin changes of the right temple region.  Neck: No cervical or supraclavicular adenopathy.  Axilla: No adenopathy.  Neuro: Alert and oriented x3.     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 ?Alejandro Robinson completed?treatment with Rituxan and bendamustine and Alejandro Robinson went through restaging studies with PET scan and bone marrow aspiration and biopsy in August 2022 which all show remission.  Follow-up PET scan in August 2022 negative.  Here for review of follow-up lab work and February 2023 PET scan which is negative.  Plan 31-month follow-up with Dr. Sinclair Ship with lab work and PET scan.    ?  ?  Patient verbalized understanding and agreement with the plan.?Time spent including ?face to face encounter, chart and diagnostic review, orders, documentation, and counseling and coordination of care was 35?minutes  ?  ?

## 2022-02-26 ENCOUNTER — Encounter: Admit: 2022-02-26 | Discharge: 2022-02-26 | Payer: BC Managed Care – PPO | Primary: Primary Care

## 2022-02-26 DIAGNOSIS — C880 Waldenstrom macroglobulinemia: Secondary | ICD-10-CM

## 2022-03-04 ENCOUNTER — Encounter: Admit: 2022-03-04 | Discharge: 2022-03-04 | Payer: BC Managed Care – PPO | Primary: Primary Care

## 2022-03-04 DIAGNOSIS — C880 Waldenstrom macroglobulinemia: Secondary | ICD-10-CM

## 2022-03-05 ENCOUNTER — Encounter: Admit: 2022-03-05 | Discharge: 2022-03-05 | Payer: BC Managed Care – PPO | Primary: Primary Care

## 2022-03-26 ENCOUNTER — Encounter: Admit: 2022-03-26 | Discharge: 2022-03-26 | Payer: BC Managed Care – PPO | Primary: Primary Care

## 2022-04-09 ENCOUNTER — Encounter: Admit: 2022-04-09 | Discharge: 2022-04-09 | Payer: BC Managed Care – PPO | Primary: Primary Care

## 2022-04-09 DIAGNOSIS — C880 Waldenstrom macroglobulinemia: Secondary | ICD-10-CM

## 2022-04-09 LAB — CBC AND DIFF
ABSOLUTE BASO COUNT: 0 K/UL (ref 0–0.20)
ABSOLUTE EOS COUNT: 0.1 K/UL (ref 0–0.45)
ABSOLUTE LYMPH COUNT: 1.1 K/UL (ref 1.0–4.8)
ABSOLUTE MONO COUNT: 0.6 K/UL (ref 0–0.80)
ABSOLUTE NEUTROPHIL: 3.4 K/UL (ref 1.8–7.0)
BASOPHILS %: 1 % (ref 0–2)
EOSINOPHILS %: 2 % (ref 0–5)
HEMATOCRIT: 47 % (ref 40–50)
HEMOGLOBIN: 16 g/dL (ref 13.5–16.5)
LYMPHOCYTES %: 20 % — ABNORMAL LOW (ref 24–44)
MCHC: 34 g/dL (ref 32.0–36.0)
MCV: 91 FL (ref 80–100)
MONOCYTES %: 12 % (ref >60)
MPV: 7.8 FL (ref 7–11)
NEUTROPHILS %: 65 % (ref 41–77)
PLATELET COUNT: 214 K/UL (ref 150–400)
RBC COUNT: 5.1 M/UL (ref 4.4–5.5)
RDW: 14 % (ref 11–15)
WBC COUNT: 5.2 K/UL (ref 4.5–11.0)

## 2022-04-09 LAB — IMMUNOGLOBULINS-IGA,IGG,IGM
IGA: 111 mg/dL (ref 70–390)
IGG: 684 mg/dL — ABNORMAL LOW (ref 762–1488)
IGM: 48 mg/dL (ref 38–328)

## 2022-04-09 LAB — COMPREHENSIVE METABOLIC PANEL
CHLORIDE: 105 MMOL/L (ref 98–110)
POTASSIUM: 3.9 MMOL/L (ref 3.5–5.1)
SODIUM: 142 MMOL/L (ref 137–147)

## 2022-04-09 LAB — LDH-LACTATE DEHYDROGENASE: LDH: 240 U/L — ABNORMAL HIGH (ref 100–210)

## 2022-04-09 NOTE — Progress Notes
Name: Alejandro Robinson          MRN: [id number]      DOB: 05-Jan-1966      AGE: 56 y.o.   DATE OF SERVICE: 04/09/2022    Subjective:             Reason for Visit:  Heme/Onc Care      Alejandro Robinson is a 56 y.o. male.      Cancer Staging   No matching staging information was found for the patient.    History of Present Illness     This is a 56 year old male who is transferring care due to retirement of his oncologist.  He had been diagnosed with Emmit Alexanders macroglobulinemia in January 2022 and had been treated with Bendamustine and rituximab ending in July 2022.  He had been in remission.  He currently feels well and denies major problems.  He had shingles in the right lower back a few weeks ago.  This has healed up and he has no residual pain.  He denies any weight loss, night sweats, lumps or pain at any site.    Oncologic history:     December 2021: History of cervical adenopathy for 2 years prior. ?Ultrasound showed enlarged bilateral cervical adenopathy. ?CT chest showed bilateral cervical and mediastinal adenopathy.     January 2022: PET scan showed diffuse adenopathy of the palatine tonsils, bilateral extensive cervical, mediastinal, axillary, retroperitoneal and pelvic adenopathy.   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   Bone marrow aspirate and biopsy showed 20% involvement by lymphoplasmacytic lymphoma. ?MYD 38 (L265P): Detected     February-July 2022: Bendamustine, rituximab x6     August 2022: PET scan showed complete response, . ?Bone marrow aspirate and biopsy showed normocellular marrow and 3% monoclonal plasma cells. ?No morphologic or immunophenotypic evidence of lymphoma/monoclonal B cells. ?Flow cytometry was negative.     Past medical history: Waldenstrom's macroglobulinemia,     Past surgical history: Left hand, left knee surgery     Social history: Never smokebut uses snuff, 5 standard drinks of alcohol/week.     Family history: Mother deceased at the age of 55 with breast cancer, maternal grandfather prostate cancer.        Review of Systems   Constitutional: Negative.    HENT: Negative.    Eyes: Negative.    Respiratory: Negative.    Cardiovascular: Negative.    Gastrointestinal: Negative.    Genitourinary: Negative.    Musculoskeletal: Negative.    Skin: Negative.    Neurological: Negative.    Psychiatric/Behavioral: Negative.          Objective:         ? multivitamin (MULTIPLE VITAMIN PO) Take  by mouth.   ? other medication one Dose. Ageless Male 2 daily   ? other medication one Dose. Super Beta Prostate     Vitals:    04/09/22 0826   BP: 129/81   BP Source: Arm, Left Upper   Pulse: 59   Temp: 36.7 ?C (98 ?F)   Resp: 20   SpO2: 98%   TempSrc: Temporal   PainSc: Zero   Weight: 97.9 kg (215 lb 12.8 oz)     Body mass index is 28.08 kg/m?[name]     Pain Score: Zero       Fatigue Scale: 0-None    Pain Addressed:  N/A    Patient Evaluated for a Clinical Trial: Patient not  eligible for a treatment trial (including not needing treatment, needs palliative care, in remission).     [place] Cooperative Oncology Group performance status is 0, Fully active, able to carry on all pre-disease performance without restriction.[name]     Physical Exam  Constitutional:       Appearance: He is well-developed.   HENT:      Head: Normocephalic and atraumatic.      Comments:  bilateral temporal rash, left greater than right with combination of papules and keratosis  Eyes:      General: No scleral icterus.     Conjunctiva/sclera: Conjunctivae normal.      Pupils: Pupils are equal, round, and reactive to light.   Neck:      Vascular: No JVD.   Cardiovascular:      Rate and Rhythm: Normal rate and regular rhythm.      Heart sounds: Normal heart sounds. No murmur heard.  Pulmonary:      Effort: Pulmonary effort is normal.      Breath sounds: Normal breath sounds. No wheezing or rales.   Chest:      Chest wall: No tenderness.   Abdominal: General: Bowel sounds are normal. There is no distension.      Palpations: Abdomen is soft. There is no mass.      Tenderness: There is no abdominal tenderness. There is no guarding.   Musculoskeletal:         General: No tenderness. Normal range of motion.      Cervical back: Normal range of motion and neck supple.   Lymphadenopathy:      Cervical: No cervical adenopathy.   Skin:     Coloration: Skin is not pale.      Findings: No rash.      Nails: There is no clubbing.   Neurological:      Mental Status: He is alert and oriented to person, place, and time.                   Latest Reference Range & Units 04/09/22 08:22   Hemoglobin 13.5 - 16.5 GM/DL 13.2   Hematocrit 40 - 50 % 47.0   Platelet Count 150 - 400 K/UL 214   White Blood Cells 4.5 - 11.0 K/UL 5.2   Neutrophils 41 - 77 % 65   Absolute Neutrophil Count 1.8 - 7.0 K/UL 3.40   Lymphocytes 24 - 44 % 20 (L)   Absolute Lymph Count 1.0 - 4.8 K/UL 1.10   Monocytes 4 - 12 % 12   Absolute Monocyte Count 0 - 0.80 K/UL 0.60   Eosinophils 0 - 5 % 2   Absolute Eosinophil Count 0 - 0.45 K/UL 0.10   Absolute Basophil Count 0 - 0.20 K/UL 0.00   Basophils 0 - 2 % 1   RBC 4.4 - 5.5 M/UL 5.15   MCV 80 - 100 FL 91.2   MCH 26 - 34 PG 31.0   MCHC 32.0 - 36.0 G/DL 44.0   MPV 7 - 11 FL 7.8   (L): Data is abnormally low  Assessment and Plan:  Waldenstrom's macroglobulinemia: No evidence for recurrence.  Other labs including CMP, quantitative immunoglobulins, serum protein electrophoresis and immunofixation are pending.  We will also check CT of the chest abdomen and pelvis.  Thereafter we will plan follow-up in 6 months with repeat labs.

## 2022-04-28 ENCOUNTER — Encounter: Admit: 2022-04-28 | Discharge: 2022-04-28 | Payer: BC Managed Care – PPO | Primary: Primary Care

## 2022-04-28 DIAGNOSIS — C880 Waldenstrom macroglobulinemia: Secondary | ICD-10-CM

## 2022-10-09 ENCOUNTER — Encounter: Admit: 2022-10-09 | Discharge: 2022-10-09 | Payer: BC Managed Care – PPO | Primary: Primary Care

## 2022-10-09 DIAGNOSIS — C880 Waldenstrom macroglobulinemia: Secondary | ICD-10-CM

## 2022-10-09 LAB — CBC AND DIFF
ABSOLUTE BASO COUNT: 0 K/UL (ref 0–0.20)
ABSOLUTE EOS COUNT: 0.1 K/UL (ref 0–0.45)
ABSOLUTE LYMPH COUNT: 1 K/UL (ref 1.0–4.8)
ABSOLUTE MONO COUNT: 0.7 K/UL (ref 0–0.80)
ABSOLUTE NEUTROPHIL: 5.1 K/UL (ref 1.8–7.0)
BASOPHILS %: 1 % (ref 0–2)
EOSINOPHILS %: 2 % (ref 0–5)
HEMATOCRIT: 45 % (ref 40–50)
HEMOGLOBIN: 15 g/dL (ref 13.5–16.5)
LYMPHOCYTES %: 14 % — ABNORMAL LOW (ref 24–44)
MCH: 30 pg (ref 26–34)
MCHC: 34 g/dL (ref 32.0–36.0)
MCV: 88 FL (ref 80–100)
MONOCYTES %: 10 % (ref 4–12)
MPV: 7.6 FL (ref 7–11)
NEUTROPHILS %: 73 % (ref 41–77)
PLATELET COUNT: 236 K/UL (ref 150–400)
RBC COUNT: 5.1 M/UL (ref 4.4–5.5)
RDW: 13 % (ref 11–15)
WBC COUNT: 6.9 K/UL (ref 4.5–11.0)

## 2022-10-09 LAB — IMMUNOGLOBULINS-IGA,IGG,IGM
IGA: 153 mg/dL (ref 70–390)
IGG: 712 mg/dL — ABNORMAL LOW (ref 762–1488)
IGM: 48 mg/dL (ref 38–328)

## 2022-10-09 MED ORDER — OMEPRAZOLE 40 MG PO CPDR
40 mg | ORAL_CAPSULE | Freq: Every day | ORAL | 1 refills | Status: AC
Start: 2022-10-09 — End: ?

## 2022-10-09 NOTE — Progress Notes
Name: Alejandro Robinson          MRN: [id number]      DOB: Jul 03, 1966      AGE: 57 y.o.   DATE OF SERVICE: 10/09/2022    Subjective:             Reason for Visit:  No chief complaint on file.      Alejandro Robinson is a 57 y.o. male.      Cancer Staging   No matching staging information was found for the patient.      History of Present Illness   Patient currently feels well and denies any major problems.  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  He has had some acid reflux.  He denies any weight loss, night sweats, pain or lumps at any site.    Oncologic history: Former patient of Dr. Jorene Guest    December 2021: History of cervical adenopathy for 2 years prior.  Ultrasound showed enlarged bilateral cervical adenopathy.  CT chest showed bilateral cervical and mediastinal adenopathy.     January 2022: PET scan showed diffuse adenopathy of the palatine tonsils, bilateral extensive cervical, mediastinal, axillary, retroperitoneal and pelvic adenopathy.   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   Bone marrow aspirate and biopsy showed 20% involvement by lymphoplasmacytic lymphoma.  MYD 88 (L265P): Detected     February-July 2022: Bendamustine, rituximab x6     August 2022: PET scan showed complete response, .  Bone marrow aspirate and biopsy showed normocellular marrow and 3% monoclonal plasma cells.  No morphologic or immunophenotypic evidence of lymphoma/monoclonal B cells.  Flow cytometry was negative.     Past medical history: Waldenstrom's macroglobulinemia,     Past surgical history: Left hand, left knee surgery     Social history: Never smokebut uses snuff, 5 standard drinks of alcohol/week.     Family history: Mother deceased at the age of 55 with breast cancer, maternal grandfather prostate cancer.        Review of Systems   Constitutional: Negative.    HENT: Negative.     Eyes: Negative.    Respiratory: Negative.     Cardiovascular: Negative.    Gastrointestinal: Negative.    Genitourinary: Negative.    Musculoskeletal: Negative.    Skin: Negative.    Neurological: Negative.    Psychiatric/Behavioral: Negative.           Objective:          multivitamin (MULTIPLE VITAMIN PO) Take  by mouth.    other medication one Dose. Ageless Male 2 daily    other medication one Dose. Super Beta Prostate     There were no vitals filed for this visit.    There is no height or weight on file to calculate BMI.                  Pain Addressed:  N/A    Patient Evaluated for a Clinical Trial: Patient not eligible for a treatment trial (including not needing treatment, needs palliative care, in remission).     [place] Cooperative Oncology Group performance status is 0, Fully active, able to carry on all pre-disease performance without restriction.[name]     Physical Exam  Constitutional:       Appearance: He is well-developed.   HENT:      Head: Normocephalic and atraumatic.   Eyes:  General: No scleral icterus.     Conjunctiva/sclera: Conjunctivae normal.      Pupils: Pupils are equal, round, and reactive to light.   Neck:      Vascular: No JVD.   Cardiovascular:      Rate and Rhythm: Normal rate and regular rhythm.      Heart sounds: Normal heart sounds. No murmur heard.  Pulmonary:      Effort: Pulmonary effort is normal.      Breath sounds: Normal breath sounds. No wheezing or rales.   Chest:      Chest wall: No tenderness.   Abdominal:      General: Bowel sounds are normal. There is no distension.      Palpations: Abdomen is soft. There is no mass.      Tenderness: There is no abdominal tenderness. There is no guarding.   Musculoskeletal:         General: No tenderness. Normal range of motion.      Cervical back: Normal range of motion and neck supple.   Lymphadenopathy:      Cervical: No cervical adenopathy.   Skin:     Coloration: Skin is not pale.      Findings: No rash.      Nails: There is no clubbing.   Neurological: Mental Status: He is alert and oriented to person, place, and time.                   Latest Reference Range & Units 10/09/22 07:59   Hemoglobin 13.5 - 16.5 GM/DL 95.6   Hematocrit 40 - 50 % 45.6   Platelet Count 150 - 400 K/UL 236   White Blood Cells 4.5 - 11.0 K/UL 6.9   Neutrophils 41 - 77 % 73   Absolute Neutrophil Count 1.8 - 7.0 K/UL 5.10   Lymphocytes 24 - 44 % 14 (L)   Absolute Lymph Count 1.0 - 4.8 K/UL 1.00   Monocytes 4 - 12 % 10   Absolute Monocyte Count 0 - 0.80 K/UL 0.70   Eosinophils 0 - 5 % 2   Absolute Eosinophil Count 0 - 0.45 K/UL 0.10   Absolute Basophil Count 0 - 0.20 K/UL 0.00   Basophils 0 - 2 % 1   RBC 4.4 - 5.5 M/UL 5.13   MCV 80 - 100 FL 88.9   MCH 26 - 34 PG 30.5   MCHC 32.0 - 36.0 G/DL 21.3   MPV 7 - 11 FL 7.6   RDW 11 - 15 % 13.9   (L): Data is abnormally low  Assessment and Plan:  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  Other labs including CMP, quantitative immunoglobulins, serum protein electrophoresis and immunofixation are pending.      GERD: Omeprazole 40 mg p.o. daily as needed.  Instructed him on dietary changes as well.    He will return for follow-up in 6 months with CBC, CMP, quantitative immunoglobulins, serum protein electrophoresis and immunofixation

## 2023-04-09 ENCOUNTER — Encounter: Admit: 2023-04-09 | Discharge: 2023-04-09 | Payer: BC Managed Care – PPO | Primary: Primary Care

## 2023-04-09 MED ORDER — OMEPRAZOLE 40 MG PO CPDR
ORAL_CAPSULE | 1 refills | Status: AC
Start: 2023-04-09 — End: ?

## 2023-04-14 NOTE — Progress Notes
Name: Alejandro Robinson          MRN: [id number]      DOB: [birth year]      AGE: 57 y.o.   DATE OF SERVICE: 04/16/2023    Subjective:             Reason for Visit:  Heme/Onc Care      Alejandro Robinson is a 57 y.o. male.      Cancer Staging   No matching staging information was found for the patient.      History of Present Illness   This is a patient of Dr. Sinclair Ship who presents today for follow-up of Waldenstr?m's macroglobulinemia. He is unaccompanied in the clinic today.  He is here for labs and 62-month follow-up.    Overall he continues to do very well.  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  He denies any fever, chills, drenching night sweats or unintentional weight loss.  Energy has been decent.  No shortness of breath or chest pain.  No problems with his bowels.  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            Oncologic history: Former patient of Dr. Jorene Guest  Waldenstr?m's macroglobulinemia    December 2021: History of cervical adenopathy for 2 years prior.  Ultrasound showed enlarged bilateral cervical adenopathy.  CT chest showed bilateral cervical and mediastinal adenopathy.     January 2022: PET scan showed diffuse adenopathy of the palatine tonsils, bilateral extensive cervical, mediastinal, axillary, retroperitoneal and pelvic adenopathy.   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   Bone marrow aspirate and biopsy showed 20% involvement by lymphoplasmacytic lymphoma.  MYD 88 (L265P): Detected     February-July 2022: Bendamustine, rituximab x6     August 2022: PET scan showed complete response, .  Bone marrow aspirate and biopsy showed normocellular marrow and 3% monoclonal plasma cells.  No morphologic or immunophenotypic evidence of lymphoma/monoclonal B cells.  Flow cytometry was negative.     03/2022: Transitioned care to Dr. Sinclair Ship        Past medical history: Waldenstrom's macroglobulinemia,     Past surgical history: Left hand, left knee surgery     Social history: Never smokebut uses snuff, 5 standard drinks of alcohol/week.     Family history: Mother deceased at the age of 105 with breast cancer, maternal grandfather prostate cancer.        Review of Systems   Constitutional: Negative.  Negative for appetite change, chills, fatigue and fever.   HENT: Negative.  Negative for mouth sores, sore throat and trouble swallowing.    Eyes: Negative.  Negative for visual disturbance.   Respiratory: Negative.  Negative for cough and shortness of breath.    Cardiovascular: Negative.  Negative for chest pain, palpitations and leg swelling.   Gastrointestinal: Negative.  Negative for abdominal pain, constipation, diarrhea, nausea and vomiting.   Endocrine: Negative.    Genitourinary: Negative.  Negative for difficulty urinating.   Musculoskeletal: Negative.    Skin: Negative.    Neurological: Negative.  Negative for dizziness and numbness.   Hematological:  Negative for adenopathy. Does not bruise/bleed easily.   Psychiatric/Behavioral: Negative.  Negative for sleep disturbance.          Objective:  multivitamin (MULTIPLE VITAMIN PO) Take  by mouth.    omeprazole DR (PRILOSEC) 40 mg capsule TAKE ONE CAPSULE BY MOUTH EVERY MORNING before breakfast    other medication one Dose. Ageless Male 2 daily    other medication one Dose. Super Beta Prostate     Vitals:    04/16/23 0831   BP: (!) 154/99   BP Source: Arm, Right Upper   Pulse: 58   Temp: 36.4 ?C (97.6 ?F)   Resp: 18   SpO2: 100%   TempSrc: Temporal   PainSc: Zero   Weight: 99.6 kg (219 lb 9.6 oz)   Height: 187.6 cm (6' 1.86)  Comment: 04/16/2023 w/shoes       Body mass index is 28.3 kg/m?[name]     Pain Score: Zero       Fatigue Scale: 0-None    Pain Addressed:  N/A    Patient Evaluated for a Clinical Trial: Patient not eligible for a treatment trial (including not needing treatment, needs palliative care, in remission).     [place] Cooperative Oncology Group performance status is 0, Fully active, able to carry on all pre-disease performance without restriction.[name]     Physical Exam  Vitals reviewed.   Constitutional:       General: He is not in acute distress.     Appearance: He is well-developed.   HENT:      Head: Normocephalic and atraumatic.      Nose: Nose normal.      Mouth/Throat:      Pharynx: No oropharyngeal exudate.   Eyes:      General: No scleral icterus.        Right eye: No discharge.         Left eye: No discharge.      Conjunctiva/sclera: Conjunctivae normal.      Pupils: Pupils are equal, round, and reactive to light.   Neck:      Vascular: No JVD.   Cardiovascular:      Rate and Rhythm: Normal rate and regular rhythm.      Heart sounds: Normal heart sounds. No murmur heard.  Pulmonary:      Effort: Pulmonary effort is normal. No respiratory distress.      Breath sounds: Normal breath sounds. No wheezing or rales.   Chest:      Chest wall: No tenderness.   Abdominal:      General: Bowel sounds are normal. There is no distension.      Palpations: Abdomen is soft. There is no mass.      Tenderness: There is no abdominal tenderness. There is no guarding.      Comments: No hepatosplenomegaly.    Musculoskeletal:         General: No tenderness. Normal range of motion.      Cervical back: Normal range of motion and neck supple.   Lymphadenopathy:      Head:      Right side of head: No submental, submandibular, preauricular, posterior auricular or occipital adenopathy.      Left side of head: No submental, submandibular, preauricular, posterior auricular or occipital adenopathy.      Cervical: No cervical adenopathy.      Upper Body:      Right upper body: No supraclavicular or axillary adenopathy.      Left upper body: No supraclavicular or axillary adenopathy.   Skin:     General: Skin is warm and dry.      Coloration: Skin is not  pale.      Findings: No rash.      Nails: There is no clubbing.   Neurological:      Mental Status: He is alert and oriented to person, place, and time.   Psychiatric:         Behavior: Behavior normal.         Thought Content: Thought content normal.         Judgment: Judgment normal.          CBC w diff    Lab Results   Component Value Date/Time    WBC 6.1 04/16/2023 07:51 AM    RBC 5.43 04/16/2023 07:51 AM    HGB 16.7 (H) 04/16/2023 07:51 AM    HCT 48.5 04/16/2023 07:51 AM    MCV 89.4 04/16/2023 07:51 AM    MCH 30.8 04/16/2023 07:51 AM    MCHC 34.4 04/16/2023 07:51 AM    RDW 13.8 04/16/2023 07:51 AM    PLTCT 245 04/16/2023 07:51 AM    MPV 7.7 04/16/2023 07:51 AM    Lab Results   Component Value Date/Time    NEUT 63 04/16/2023 07:51 AM    ANC 3.80 04/16/2023 07:51 AM    LYMA 23 (L) 04/16/2023 07:51 AM    ALC 1.40 04/16/2023 07:51 AM    MONA 11 04/16/2023 07:51 AM    AMC 0.70 04/16/2023 07:51 AM    EOSA 2 04/16/2023 07:51 AM    AEC 0.10 04/16/2023 07:51 AM    BASA 1 04/16/2023 07:51 AM    ABC 0.10 04/16/2023 07:51 AM                      Problem   Waldenstrom Macroglobulinemia (Hcc)       Waldenstrom macroglobulinemia (HCC)  He completed 6 cycles of Bendamustine and Rituxan in July 2022 with complete response on PET scan in August 2022.  He continues to do well at this time.  CBC as above, CMP, SPEP and immunoglobulins pending.  No new symptomatology.  No evidence of recurrence on exam today.  Plan for him to follow-up with Dr. Sinclair Ship with repeat labs in 6 months.      The above assessment and plan was reviewed with the patient and he verbalizes understanding and questions were answered to their satisfaction. he has contact information for the clinic and knows to call with any worsening symptoms or any questions or concerns.     Time spent reviewing previous notes, records and labs as well as face to face with patient, documentation, entering orders and coordination of care was 30 minutes.     This note is partially generated using voice recognition software. Please excuse any typographical errors.

## 2023-04-16 ENCOUNTER — Encounter: Admit: 2023-04-16 | Discharge: 2023-04-16 | Payer: BC Managed Care – PPO | Primary: Primary Care

## 2023-04-16 DIAGNOSIS — C880 Waldenstrom macroglobulinemia: Secondary | ICD-10-CM

## 2023-04-16 LAB — IMMUNOGLOBULINS-IGA,IGG,IGM
IGA: 181 mg/dL (ref 70–390)
IGG: 833 mg/dL (ref 762–1488)
IGM: 60 mg/dL (ref 38–328)

## 2023-04-16 LAB — COMPREHENSIVE METABOLIC PANEL
ALBUMIN: 5.4 g/dL — ABNORMAL HIGH (ref 3.5–5.0)
ALK PHOSPHATASE: 51 U/L (ref 25–110)
ALT: 24 U/L (ref 7–56)
ANION GAP: 10 (ref 3–12)
AST: 29 U/L (ref 7–40)
BLD UREA NITROGEN: 19 mg/dL (ref 7–25)
CALCIUM: 10 mg/dL (ref 8.5–10.6)
CHLORIDE: 100 MMOL/L (ref 98–110)
CO2: 29 MMOL/L (ref 21–30)
CREATININE: 1 mg/dL (ref 0.4–1.24)
EGFR: >60 mL/min (ref >60)
GLUCOSE,PANEL: 115 mg/dL — ABNORMAL HIGH (ref 70–100)
POTASSIUM: 4.5 MMOL/L (ref 3.5–5.1)
SODIUM: 139 MMOL/L (ref 137–147)
TOTAL BILIRUBIN: 0.6 mg/dL (ref 0.2–1.3)
TOTAL PROTEIN: 8.3 g/dL — ABNORMAL HIGH (ref 6.0–8.0)

## 2023-07-31 IMAGING — CR TIBIALT
4 series · 4 of 4 positions shown · non-contrast
Comparison: none

[x tib-fib ap right]
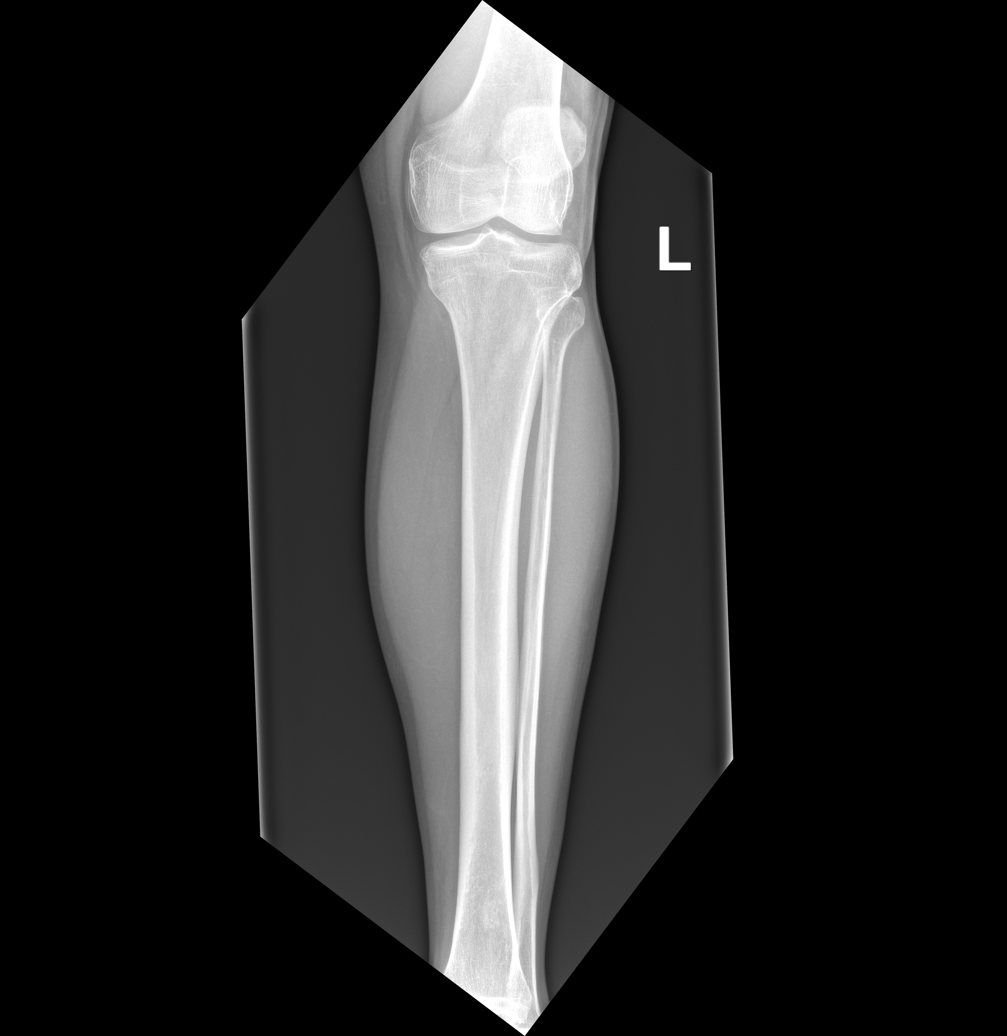

[x tib-fib lat right (1 of 3)]
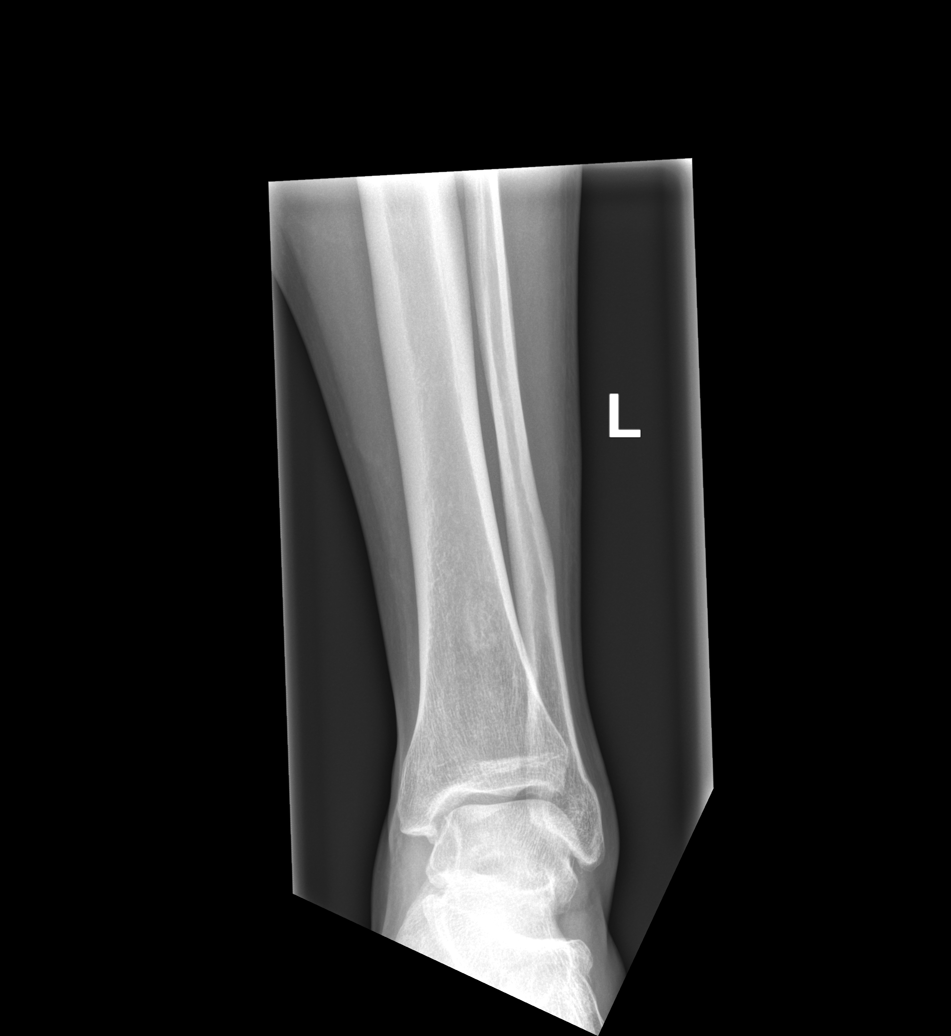

[x tib-fib lat right (2 of 3)]
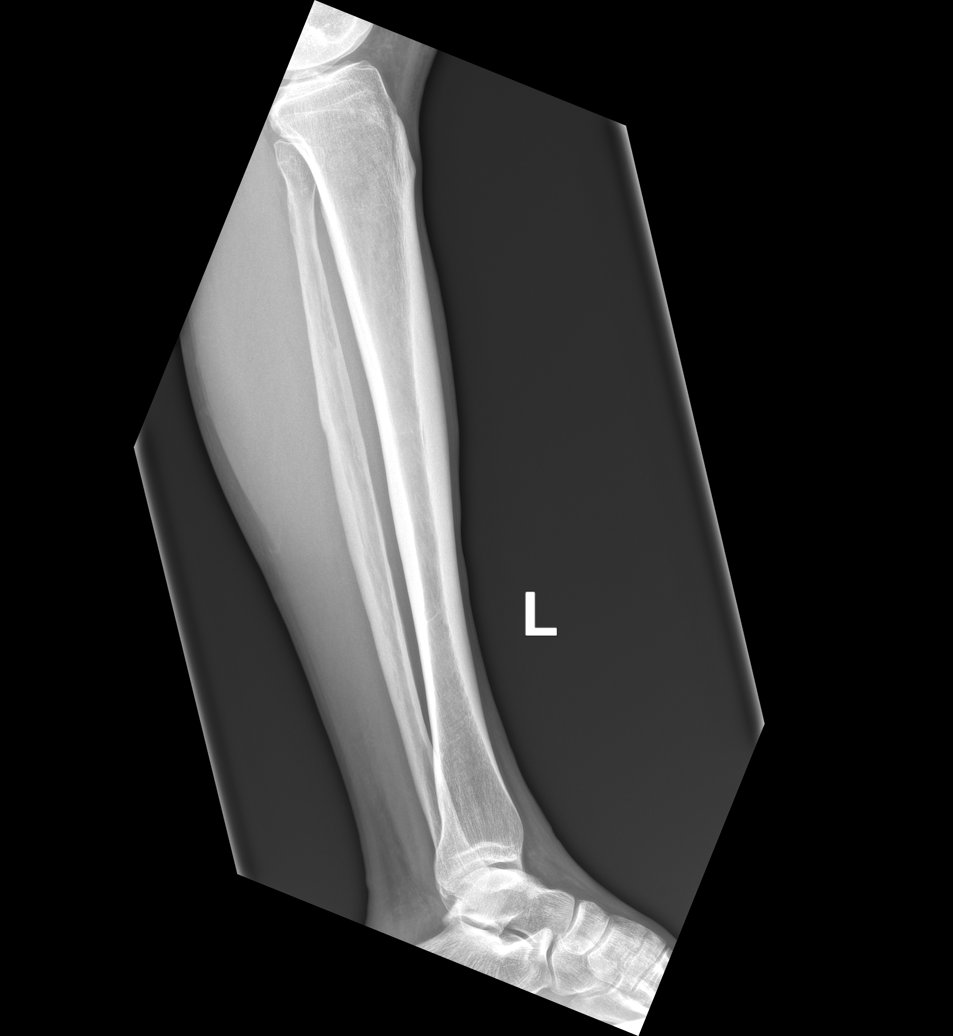

[x tib-fib lat right (3 of 3)]
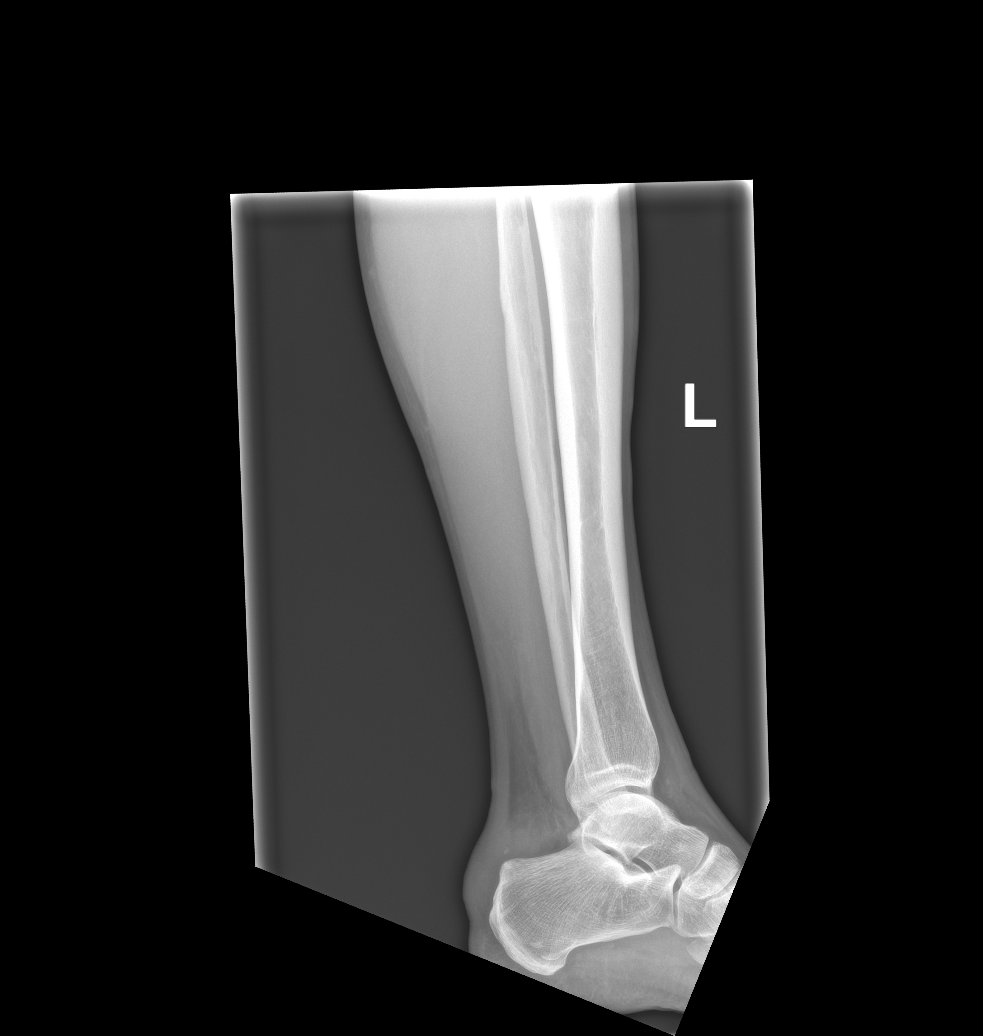

[4 of 4 positions shown; findings below may reference images not displayed]

DIAGNOSTIC STUDIES

EXAM

XR tibia fibula LT 2V

INDICATION

lt leg pain
LT LOWER LEG BUMP ON HEEL, PAIN FOR THE LAST 4-6 MONTHS, DENIES TRAUMA TO LEG

TECHNIQUE

AP and lateral views

COMPARISONS

None available

FINDINGS

No fractures or other osseous abnormalities are seen.

IMPRESSION

Negative left tib-fib.

There is prominent soft tissue swelling along posterior calcaneus at the insertion of the Achilles.
Injury to the distal Achilles should be excluded clinically.

Tech Notes:

LT LOWER LEG BUMP ON HEEL, PAIN FOR THE LAST 4-6 MONTHS, DENIES TRAUMA TO LEG

## 2023-10-05 ENCOUNTER — Encounter: Admit: 2023-10-05 | Discharge: 2023-10-05 | Payer: BC Managed Care – PPO | Primary: Primary Care

## 2023-10-05 MED ORDER — OMEPRAZOLE 40 MG PO CPDR
40 mg | ORAL_CAPSULE | Freq: Every day | ORAL | 1 refills | Status: AC
Start: 2023-10-05 — End: ?

## 2023-10-08 NOTE — Assessment & Plan Note
 Completed 6 cycles of Bendamustine and Rituxan in July 2022 with complete response on PET scan in August 2022.  He continues to do well at this time.  CBC as above, CMP, SPEP and immunoglobulins pending.  No new symptomatology.  No evidence of recurrence on exam today.  Plan for him to follow-up with Dr. Sinclair Ship with repeat labs in 6 months.

## 2023-10-08 NOTE — Progress Notes
 Name: Alejandro Robinson          MRN: [id number]      DOB: 1966/02/04      AGE: 58 y.o.   DATE OF SERVICE: 10/14/2023    Subjective:             Reason for Visit:  Heme/Onc Care      Alejandro Robinson is a 58 y.o. male.      Cancer Staging   No matching staging information was found for the patient.      History of Present Illness   This is a patient of Dr. Sinclair Ship who presents today for follow-up of Waldenstr?m's macroglobulinemia. He is unaccompanied in the clinic today.  He is here for labs and 60-month follow-up.    Overall he continues to do very well.  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  He denies any fever, chills, drenching night sweats or unintentional weight loss.  Energy has been decent.  No shortness of breath or chest pain.  No problems with his bowels. He continues to follow with dermatology and saw them recently.               Oncologic history: Former patient of Dr. Jorene Guest  Waldenstr?m's macroglobulinemia    December 2021: History of cervical adenopathy for 2 years prior.  Ultrasound showed enlarged bilateral cervical adenopathy.  CT chest showed bilateral cervical and mediastinal adenopathy.     January 2022: PET scan showed diffuse adenopathy of the palatine tonsils, bilateral extensive cervical, mediastinal, axillary, retroperitoneal and pelvic adenopathy.   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   Bone marrow aspirate and biopsy showed 20% involvement by lymphoplasmacytic lymphoma.  MYD 88 (L265P): Detected     February-July 2022: Bendamustine, rituximab x6     August 2022: PET scan showed complete response, .  Bone marrow aspirate and biopsy showed normocellular marrow and 3% monoclonal plasma cells.  No morphologic or immunophenotypic evidence of lymphoma/monoclonal B cells.  Flow cytometry was negative.     03/2022: Transitioned care to Dr. Sinclair Ship        Past medical history: Waldenstrom's macroglobulinemia,     Past surgical history: Left hand, left knee surgery     Social history: Never smokebut uses snuff, 5 standard drinks of alcohol/week.     Family history: Mother deceased at the age of 64 with breast cancer, maternal grandfather prostate cancer.        Review of Systems   Constitutional: Negative.  Negative for appetite change, chills, fatigue and fever.   HENT: Negative.  Negative for mouth sores, sore throat and trouble swallowing.    Eyes: Negative.  Negative for visual disturbance.   Respiratory: Negative.  Negative for cough and shortness of breath.    Cardiovascular: Negative.  Negative for chest pain, palpitations and leg swelling.   Gastrointestinal: Negative.  Negative for abdominal pain, constipation, diarrhea, nausea and vomiting.   Endocrine: Negative.    Genitourinary: Negative.  Negative for difficulty urinating.   Musculoskeletal: Negative.    Skin: Negative.    Neurological: Negative.  Negative for dizziness and numbness.   Hematological:  Negative for adenopathy. Does not bruise/bleed easily.   Psychiatric/Behavioral: Negative.  Negative for sleep disturbance.          Objective:          multivitamin (MULTIPLE VITAMIN PO) Take  by mouth.    omeprazole DR (PRILOSEC)  40 mg capsule TAKE ONE CAPSULE BY MOUTH EVERY MORNING before breakfast    other medication one Dose. Ageless Male 2 daily    other medication one Dose. Super Beta Prostate     Vitals:    10/14/23 0826   BP: 134/70   BP Source: Arm, Right Upper   Pulse: 65   Temp: 36 ?C (96.8 ?F)   Resp: 18   SpO2: 100%   TempSrc: Temporal   PainSc: Zero   Weight: 96.9 kg (213 lb 9.6 oz)         Body mass index is 27.53 kg/m?[name]     Pain Score: Zero       Fatigue Scale: 0-None    Pain Addressed:  N/A    Patient Evaluated for a Clinical Trial: Patient not eligible for a treatment trial (including not needing treatment, needs palliative care, in remission).     [place] Cooperative Oncology Group performance status is 0, Fully active, able to carry on all pre-disease performance without restriction.[name]     Physical Exam  Vitals reviewed.   Constitutional:       General: He is not in acute distress.     Appearance: He is well-developed.   HENT:      Head: Normocephalic and atraumatic.      Nose: Nose normal.      Mouth/Throat:      Pharynx: No oropharyngeal exudate.   Eyes:      General: No scleral icterus.        Right eye: No discharge.         Left eye: No discharge.      Conjunctiva/sclera: Conjunctivae normal.      Pupils: Pupils are equal, round, and reactive to light.   Neck:      Vascular: No JVD.   Cardiovascular:      Rate and Rhythm: Normal rate and regular rhythm.      Heart sounds: Normal heart sounds. No murmur heard.  Pulmonary:      Effort: Pulmonary effort is normal. No respiratory distress.      Breath sounds: Normal breath sounds. No wheezing or rales.   Chest:      Chest wall: No tenderness.   Abdominal:      General: Bowel sounds are normal. There is no distension.      Palpations: Abdomen is soft. There is no mass.      Tenderness: There is no abdominal tenderness. There is no guarding.      Comments: No hepatosplenomegaly.    Musculoskeletal:         General: No tenderness. Normal range of motion.      Cervical back: Normal range of motion and neck supple.   Lymphadenopathy:      Head:      Right side of head: No submental, submandibular, preauricular, posterior auricular or occipital adenopathy.      Left side of head: No submental, submandibular, preauricular, posterior auricular or occipital adenopathy.      Cervical: No cervical adenopathy.      Upper Body:      Right upper body: No supraclavicular or axillary adenopathy.      Left upper body: No supraclavicular or axillary adenopathy.   Skin:     General: Skin is warm and dry.      Coloration: Skin is not pale.      Findings: No rash.      Nails: There is no clubbing.   Neurological:  Mental Status: He is alert and oriented to person, place, and time.   Psychiatric: Behavior: Behavior normal.         Thought Content: Thought content normal.         Judgment: Judgment normal.          CBC w diff    Lab Results   Component Value Date/Time    WBC 7.10 10/14/2023 08:37 AM    RBC 5.29 10/14/2023 08:37 AM    HGB 16.2 10/14/2023 08:37 AM    HCT 47.3 10/14/2023 08:37 AM    MCV 89.5 10/14/2023 08:37 AM    MCH 30.7 10/14/2023 08:37 AM    MCHC 34.3 10/14/2023 08:37 AM    RDW 13.9 10/14/2023 08:37 AM    PLTCT 202 10/14/2023 08:37 AM    MPV 7.9 10/14/2023 08:37 AM    Lab Results   Component Value Date/Time    NEUT 71.6 10/14/2023 08:37 AM    ANC 5.10 10/14/2023 08:37 AM    LYMA 16.0 (L) 10/14/2023 08:37 AM    ALC 1.10 10/14/2023 08:37 AM    MONA 9.6 10/14/2023 08:37 AM    AMC 0.70 10/14/2023 08:37 AM    EOSA 1.5 10/14/2023 08:37 AM    AEC 0.10 10/14/2023 08:37 AM    BASA 1.3 10/14/2023 08:37 AM    ABC 0.10 10/14/2023 08:37 AM                      Problem   Waldenstrom Macroglobulinemia         Waldenstrom macroglobulinemia (HCC)  Completed 6 cycles of Bendamustine and Rituxan in July 2022 with complete response on PET scan in August 2022.  He continues to do well at this time.  CBC as above, CMP, SPEP and immunoglobulins pending.  No new symptomatology.  No evidence of recurrence on exam today.  Plan for him to follow-up with Dr. Sinclair Ship with repeat labs in 6 months.         The above assessment and plan was reviewed with the patient and he verbalizes understanding and questions were answered to their satisfaction. he has contact information for the clinic and knows to call with any worsening symptoms or any questions or concerns.     Time spent reviewing previous notes, records and labs as well as face to face with patient, documentation, entering orders and coordination of care was 30 minutes.     This note is partially generated using voice recognition software. Please excuse any typographical errors.

## 2023-10-14 ENCOUNTER — Encounter: Admit: 2023-10-14 | Discharge: 2023-10-14 | Payer: Commercial Managed Care - PPO | Primary: Primary Care

## 2023-10-14 DIAGNOSIS — C880 Waldenstrom macroglobulinemia: Secondary | ICD-10-CM

## 2024-04-06 ENCOUNTER — Encounter: Admit: 2024-04-06 | Discharge: 2024-04-06 | Payer: PRIVATE HEALTH INSURANCE | Primary: Primary Care

## 2024-04-06 MED ORDER — OMEPRAZOLE 40 MG PO CPDR
40 mg | ORAL_CAPSULE | Freq: Every day | ORAL | 1 refills | 90.00000 days | Status: AC
Start: 2024-04-06 — End: ?

## 2024-04-10 NOTE — Progress Notes
 Name: Alejandro Robinson          MRN: [id number]      DOB: [birth year]      AGE: 58 y.o.   DATE OF SERVICE: 04/11/2024    Subjective:             Reason for Visit:  No chief complaint on file.      Alejandro Robinson is a 58 y.o. male.      Cancer Staging   No matching staging information was found for the patient.      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  Patient currently feels well and denies any major problems.  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    He denies any weight loss, night sweats, pain or lumps at any site.    Oncologic history: Former patient of Dr. Deland Fujisawa    December 2021: History of cervical adenopathy for 2 years prior.  Ultrasound showed enlarged bilateral cervical adenopathy.  CT chest showed bilateral cervical and mediastinal adenopathy.     January 2022: PET scan showed diffuse adenopathy of the palatine tonsils, bilateral extensive cervical, mediastinal, axillary, retroperitoneal and pelvic adenopathy.   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   Bone marrow aspirate and biopsy showed 20% involvement by lymphoplasmacytic lymphoma.  MYD 88 (L265P): Detected     February-July 2022: Bendamustine , rituximab  x6     August 2022: PET scan showed complete response, .  Bone marrow aspirate and biopsy showed normocellular marrow and 3% monoclonal plasma cells.  No morphologic or immunophenotypic evidence of lymphoma/monoclonal B cells.  Flow cytometry was negative.     Past medical history: Waldenstrom's macroglobulinemia,     Past surgical history: Left hand, left knee surgery     Social history: Never smokebut uses snuff, 5 standard drinks of alcohol/week.     Family history: Mother deceased at the age of 42 with breast cancer, maternal grandfather prostate cancer.        Review of Systems Constitutional: Negative.    HENT: Negative.     Eyes: Negative.    Respiratory: Negative.     Cardiovascular: Negative.    Gastrointestinal: Negative.    Genitourinary: Negative.    Musculoskeletal: Negative.    Skin: Negative.    Neurological: Negative.    Psychiatric/Behavioral: Negative.           Objective:          multivitamin (MULTIPLE VITAMIN PO) Take  by mouth.    omeprazole  DR (PRILOSEC) 40 mg capsule TAKE ONE CAPSULE BY MOUTH EVERY MORNING before breakfast    other medication one Dose. Ageless Male 2 daily    other medication one Dose. Super Beta Prostate     There were no vitals filed for this visit.    There is no height or weight on file to calculate BMI.                  Pain Addressed:  N/A    Patient Evaluated for a Clinical Trial: Patient not eligible for a treatment trial (including not needing treatment, needs palliative care, in remission).     [place] Cooperative Oncology Group performance status is 0, Fully active, able to carry on all pre-disease performance without restriction.SABRA     Physical Exam  Constitutional:  Appearance: He is well-developed.   HENT:      Head: Normocephalic and atraumatic.   Eyes:      General: No scleral icterus.     Conjunctiva/sclera: Conjunctivae normal.      Pupils: Pupils are equal, round, and reactive to light.   Neck:      Vascular: No JVD.   Cardiovascular:      Rate and Rhythm: Normal rate and regular rhythm.      Heart sounds: Normal heart sounds. No murmur heard.  Pulmonary:      Effort: Pulmonary effort is normal.      Breath sounds: Normal breath sounds. No wheezing or rales.   Chest:      Chest wall: No tenderness.   Abdominal:      General: Bowel sounds are normal. There is no distension.      Palpations: Abdomen is soft. There is no mass.      Tenderness: There is no abdominal tenderness. There is no guarding.   Musculoskeletal:         General: No tenderness. Normal range of motion.      Cervical back: Normal range of motion and neck supple. Lymphadenopathy:      Cervical: No cervical adenopathy.   Skin:     Coloration: Skin is not pale.      Findings: No rash.      Nails: There is no clubbing.   Neurological:      Mental Status: He is alert and oriented to person, place, and time.                   Latest Reference Range & Units 04/11/24 14:35   White Blood Cells 4.50 - 11.00 10*3/uL 6.20   Hemoglobin 13.5 - 16.5 g/dL 83.8   Hematocrit 59.9 - 50.0 % 48.1   Platelet Count 150 - 400 10*3/uL 217   Neutrophils 41.0 - 77.0 % 64.6   Absolute Neutrophil Count 1.80 - 7.00 10*3/uL 4.00   Lymphocytes 24.0 - 44.0 % 22.8 (L)   Absolute Lymph Count 1.00 - 4.80 10*3/uL 1.40   Monocytes 4.0 - 12.0 % 10.0   Absolute Monocyte Count 0.00 - 0.80 10*3/uL 0.60   Eosinophils 0.0 - 5.0 % 2.1   Absolute Eosinophil Count 0.00 - 0.45 10*3/uL 0.10   Absolute Basophil Count 0.00 - 0.20 10*3/uL 0.00   Basophils 0.0 - 2.0 % 0.5   RBC 4.40 - 5.50 10*6/uL 5.38   MCV 80.0 - 100.0 fL 89.5   MCH 26.0 - 34.0 pg 30.0   MCHC 32.0 - 36.0 g/dL 66.3   MPV 7.0 - 88.9 fL 8.3   RDW 11.0 - 15.0 % 13.6   (L): Data is abnormally low  Assessment and Plan:  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  Other labs including CMP, quantitative immunoglobulins, serum protein electrophoresis and immunofixation are pending.    He will return for follow-up in 6 months with CBC, CMP, quantitative immunoglobulins, serum protein electrophoresis and immunofixation

## 2024-04-11 ENCOUNTER — Encounter: Admit: 2024-04-11 | Discharge: 2024-04-11 | Payer: PRIVATE HEALTH INSURANCE | Primary: Primary Care

## 2024-04-11 DIAGNOSIS — C880 Waldenstrom macroglobulinemia: Principal | ICD-10-CM

## 2024-07-21 ENCOUNTER — Encounter: Admit: 2024-07-21 | Discharge: 2024-07-21 | Payer: PRIVATE HEALTH INSURANCE | Primary: Primary Care

## 2024-08-18 ENCOUNTER — Encounter: Admit: 2024-08-18 | Discharge: 2024-08-18 | Payer: PRIVATE HEALTH INSURANCE | Primary: Primary Care

## 2024-08-21 ENCOUNTER — Encounter: Admit: 2024-08-21 | Discharge: 2024-08-21 | Payer: PRIVATE HEALTH INSURANCE | Primary: Primary Care
